# Patient Record
Sex: Female | Born: 1990 | Race: Black or African American | Hispanic: No | Marital: Single | Smoking: Never smoker
Health system: Southern US, Community
[De-identification: ages and names within clinical notes are randomized; demographics above are authoritative.]

## PROBLEM LIST (undated history)

## (undated) ENCOUNTER — Inpatient Hospital Stay (HOSPITAL_COMMUNITY): Payer: Self-pay

## (undated) DIAGNOSIS — J40 Bronchitis, not specified as acute or chronic: Secondary | ICD-10-CM

## (undated) DIAGNOSIS — R51 Headache: Secondary | ICD-10-CM

## (undated) DIAGNOSIS — T148XXA Other injury of unspecified body region, initial encounter: Secondary | ICD-10-CM

## (undated) HISTORY — DX: Bronchitis, not specified as acute or chronic: J40

## (undated) HISTORY — PX: NO PAST SURGERIES: SHX2092

---
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
## 2012-03-16 ENCOUNTER — Other Ambulatory Visit: Payer: Self-pay | Admitting: Family Medicine

## 2012-03-16 NOTE — Telephone Encounter (Signed)
MOM CALLED TO SAY PT HAS PINK EYE AGAIN.  WOULD LIKE MEDS CALL TO [facility] [address]  ALSO, NEEDS OOW NOTE   BEST [phone]

## 2012-03-16 NOTE — Addendum Note (Signed)
Addended by: Jacqualyn Posey on: 03/16/2012 07:14 PM   Modules accepted: Orders

## 2013-01-20 ENCOUNTER — Ambulatory Visit (INDEPENDENT_AMBULATORY_CARE_PROVIDER_SITE_OTHER): Payer: Managed Care, Other (non HMO) | Admitting: Family Medicine

## 2013-01-20 VITALS — BP 131/86 | HR 80 | Temp 98.0°F | Resp 16 | Ht 67.0 in | Wt 275.4 lb

## 2013-01-20 DIAGNOSIS — G43909 Migraine, unspecified, not intractable, without status migrainosus: Secondary | ICD-10-CM

## 2013-01-20 DIAGNOSIS — N912 Amenorrhea, unspecified: Secondary | ICD-10-CM

## 2013-01-20 LAB — POCT URINE PREGNANCY: Preg Test, Ur: NEGATIVE

## 2013-01-20 MED ORDER — IBUPROFEN 600 MG PO TABS: 600.0000 mg | ORAL_TABLET | Freq: Three times a day (TID) | ORAL | Status: DC | PRN

## 2013-01-20 NOTE — Patient Instructions (Signed)
Migraine Headache A migraine headache is an intense, throbbing pain on one or both sides of your head. A migraine can last for 30 minutes to several hours. CAUSES  The exact cause of a migraine headache is not always known. However, a migraine may be caused when nerves in the brain become irritated and release chemicals that cause inflammation. This causes pain. SYMPTOMS  Pain on one or both sides of your head.  Pulsating or throbbing pain.  Severe pain that prevents daily activities.  Pain that is aggravated by any physical activity.  Nausea, vomiting, or both.  Dizziness.  Pain with exposure to bright lights, loud noises, or activity.  General sensitivity to bright lights, loud noises, or smells. Before you get a migraine, you may get warning signs that a migraine is coming (aura). An aura may include:  Seeing flashing lights.  Seeing bright spots, halos, or zig-zag lines.  Having tunnel vision or blurred vision.  Having feelings of numbness or tingling.  Having trouble talking.  Having muscle weakness. MIGRAINE TRIGGERS  Alcohol.  Smoking.  Stress.  Menstruation.  Aged cheeses.  Foods or drinks that contain nitrates, glutamate, aspartame, or tyramine.  Lack of sleep.  Chocolate.  Caffeine.  Hunger.  Physical exertion.  Fatigue.  Medicines used to treat chest pain (nitroglycerine), birth control pills, estrogen, and some blood pressure medicines. DIAGNOSIS  A migraine headache is often diagnosed based on:  Symptoms.  Physical examination.  A CT scan or MRI of your head. TREATMENT Medicines may be given for pain and nausea. Medicines can also be given to help prevent recurrent migraines.  HOME CARE INSTRUCTIONS  Only take over-the-counter or prescription medicines for pain or discomfort as directed by your caregiver. The use of long-term narcotics is not recommended.  Lie down in a dark, quiet room when you have a migraine.  Keep a journal  to find out what may trigger your migraine headaches. For example, write down:  What you eat and drink.  How much sleep you get.  Any change to your diet or medicines.  Limit alcohol consumption.  Quit smoking if you smoke.  Get 7 to 9 hours of sleep, or as recommended by your caregiver.  Limit stress.  Keep lights dim if bright lights bother you and make your migraines worse. SEEK IMMEDIATE MEDICAL CARE IF:   Your migraine becomes severe.  You have a fever.  You have a stiff neck.  You have vision loss.  You have muscular weakness or loss of muscle control.  You start losing your balance or have trouble walking.  You feel faint or pass out.  You have severe symptoms that are different from your first symptoms. MAKE SURE YOU:   Understand these instructions.  Will watch your condition.  Will get help right away if you are not doing well or get worse. Document Released: 09/09/2005 Document Revised: 12/02/2011 Document Reviewed: 08/30/2011 Exit[employer] Patient Information 2013 [employer].  

## 2013-01-20 NOTE — Progress Notes (Signed)
SUBJECTIVE:  Lisa Rubio is a 22 y.o. female who complains of migraine headache for 1 day(s). She has a well established history of recurrent migraines.  Patient did stop her birth control secondary to spotting and is sexually active but does use protection.   Description of pain: throbbing pain, unilateral in the right temporal area. Associated symptoms: dehydration, facial pain and light sensitivity. Patient has already taken [facility] powder for this headache with minimal relief.  Current Outpatient Prescriptions  Medication Sig Dispense Refill  . norethindrone-ethinyl estradiol (JUNEL FE,GILDESS FE,LOESTRIN FE) 1-20 MG-MCG tablet Take 1 tablet by mouth daily.      Marland Kitchen ibuprofen (ADVIL,MOTRIN) 600 MG tablet Take 1 tablet (600 mg total) by mouth every 8 (eight) hours as needed for pain.  30 tablet  0   No current facility-administered medications for this visit.    There are no associated abnormal neurological symptoms such as TIA's, loss of balance, loss of vision or speech, numbness or weakness on review. Past neurological history: negative for stroke, MS, epilepsy, or brain tumor.   OBJECTIVE:  Patient appears in pain, preferring to lie in a darkened room. Her vitals are normal. Alert and oriented x 3.  Ears and throat normal. Neck fully supple without nodes. Sinuses non tender. Cranial nerves are normal.  Fundi are normal with sharp disc margins, no papilledema, hemorrhages or exudates noted. DTR's normal and symmetric. Babinski sign absent.  Mental status normal. Cerebellar function normal.     I preg negative.  Lab Results  Component Value Date   PREGTESTUR Negative 01/20/2013    ASSESSMENT:  Migraine headache  PLAN:  Treatment today -  see orders as documented in the electronic medical record. ROV prn if pain does not resolve after treatment.

## 2013-03-09 ENCOUNTER — Ambulatory Visit (INDEPENDENT_AMBULATORY_CARE_PROVIDER_SITE_OTHER): Payer: Managed Care, Other (non HMO) | Admitting: Family Medicine

## 2013-03-09 VITALS — BP 119/76 | HR 96 | Temp 98.3°F | Resp 16 | Ht 67.0 in | Wt 271.0 lb

## 2013-03-09 DIAGNOSIS — N926 Irregular menstruation, unspecified: Secondary | ICD-10-CM

## 2013-03-09 DIAGNOSIS — Z331 Pregnant state, incidental: Secondary | ICD-10-CM

## 2013-03-09 LAB — POCT URINE PREGNANCY: Preg Test, Ur: POSITIVE

## 2013-03-09 NOTE — Progress Notes (Signed)
S:  22 yo woman who stopped her birth control pills when she was spotting.  Her breasts have become sore. G0P0 Last normal period:  unsure Planning on keeping the pregnancy. Going to [employer] studying liberal studies. She grew up in [place].  Now lives in [place] with boyfriend. No nausea.  Objective:  NAD  Results for orders placed in visit on 03/09/13  POCT URINE PREGNANCY      Result Value Range   Preg Test, Ur Positive     Missed period - Plan: POCT urine pregnancy, PR PRENATAL VITAMINS 30 DAY, Ambulatory referral to Obstetrics / Gynecology  Signed, Elvina Sidle, MD

## 2013-03-09 NOTE — Patient Instructions (Addendum)
Shea Evans, MD  [phone]   Pregnancy If you are planning on getting pregnant, it is a good idea to make a preconception appointment with your caregiver to discuss having a healthy lifestyle before getting pregnant. This includes diet, weight, exercise, taking prenatal vitamins (especially folic acid, which helps prevent brain and spinal cord defects), avoiding alcohol, smoking and illegal drugs, medical problems (diabetes, convulsions), family history of genetic problems, working conditions, and immunizations. It is better to have knowledge of these things and do something about them before getting pregnant. During your pregnancy, it is important to follow certain guidelines in order to have a healthy baby. It is very important to get good prenatal care and follow your caregiver's instructions. Prenatal care includes all the medical care you receive before your baby's birth. This helps to prevent problems during the pregnancy and childbirth. HOME CARE INSTRUCTIONS   Start your prenatal visits by the 12th week of pregnancy or earlier, if possible. At first, appointments are usually scheduled monthly. They become more frequent in the last 2 months before delivery. It is important that you keep your caregiver's appointments and follow your caregiver's instructions regarding medication use, exercise, and diet.  During pregnancy, you are providing food for you and your baby. Eat a regular, well-balanced diet. Choose foods such as meat, fish, milk and other dairy products, vegetables, fruits, whole-grain breads and cereals. Your caregiver will inform you of the ideal weight gain depending on your current height and weight. Drink lots of liquids. Try to drink 8 glasses of water a day.  Alcohol is associated with a number of birth defects including fetal alcohol syndrome. It is best to avoid alcohol completely. Smoking will cause low birth rate and prematurity. Use of alcohol and nicotine during your  pregnancy also increases the chances that your child will be chemically dependent later in their life and may contribute to SIDS (Sudden Infant Death Syndrome).  Do not use illegal drugs.  Only take prescription or over-the-counter medications that are recommended by your caregiver. Other medications can cause genetic and physical problems in the baby.  Morning sickness can often be helped by keeping soda crackers at the bedside. Eat a few before getting up in the morning.  A sexual relationship may be continued until near the end of pregnancy if there are no other problems such as early (premature) leaking of amniotic fluid from the membranes, vaginal bleeding, painful intercourse or belly (abdominal) pain.  Exercise regularly. Check with your caregiver if you are unsure of the safety of some of your exercises.  Do not use hot tubs, steam rooms or saunas. These increase the risk of fainting and hurting yourself and the baby. Swimming is OK for exercise. Get plenty of rest, including afternoon naps when possible, especially in the third trimester.  Avoid toxic odors and chemicals.  Do not wear high heels. They may cause you to lose your balance and fall.  Do not lift over 5 pounds. If you do lift anything, lift with your legs and thighs, not your back.  Avoid long trips, especially in the third trimester.  If you have to travel out of the city or state, take a copy of your medical records with you. SEEK IMMEDIATE MEDICAL CARE IF:   You develop an unexplained oral temperature above 102 F (38.9 C), or as your caregiver suggests.  You have leaking of fluid from the vagina. If leaking membranes are suspected, take your temperature and inform your caregiver of this when  you call.  There is vaginal spotting or bleeding. Notify your caregiver of the amount and how many pads are used.  You continue to feel sick to your stomach (nauseous) and have no relief from remedies suggested, or you  throw up (vomit) blood or coffee ground like materials.  You develop upper abdominal pain.  You have round ligament discomfort in the lower abdominal area. This still must be evaluated by your caregiver.  You feel contractions of the uterus.  You do not feel the baby move, or there is less movement than before.  You have painful urination.  You have abnormal vaginal discharge.  You have persistent diarrhea.  You get a severe headache.  You have problems with your vision.  You develop muscle weakness.  You feel dizzy and faint.  You develop shortness of breath.  You develop chest pain.  You have back pain that travels down to your leg and feet.  You feel irregular or a very fast heartbeat.  You develop excessive weight gain in a short period of time (5 pounds in 3 to 5 days).  You are involved in a domestic violence situation. Document Released: 09/09/2005 Document Revised: 03/10/2012 Document Reviewed: 03/03/2009 [facility] Patient Information 2014 [employer], [place].

## 2013-03-10 ENCOUNTER — Inpatient Hospital Stay (HOSPITAL_COMMUNITY): Payer: Managed Care, Other (non HMO)

## 2013-03-10 ENCOUNTER — Inpatient Hospital Stay (HOSPITAL_COMMUNITY)
Admission: AD | Admit: 2013-03-10 | Discharge: 2013-03-10 | DRG: 781 | Disposition: A | Discharge status: Home / Self Care | Payer: Managed Care, Other (non HMO) | Source: Ambulatory Visit | Attending: Obstetrics and Gynecology | Admitting: Obstetrics and Gynecology

## 2013-03-10 ENCOUNTER — Encounter (HOSPITAL_COMMUNITY): Payer: Self-pay

## 2013-03-10 DIAGNOSIS — N644 Mastodynia: Secondary | ICD-10-CM | POA: Diagnosis present

## 2013-03-10 DIAGNOSIS — R109 Unspecified abdominal pain: Secondary | ICD-10-CM | POA: Diagnosis present

## 2013-03-10 DIAGNOSIS — O99891 Other specified diseases and conditions complicating pregnancy: Secondary | ICD-10-CM | POA: Diagnosis present

## 2013-03-10 LAB — URINALYSIS, ROUTINE W REFLEX MICROSCOPIC
Glucose, UA: NEGATIVE mg/dL
Ketones, ur: NEGATIVE mg/dL
Leukocytes, UA: NEGATIVE
Protein, ur: NEGATIVE mg/dL
Urobilinogen, UA: 4 mg/dL — ABNORMAL HIGH (ref 0.0–1.0)

## 2013-03-10 LAB — CBC
MCV: 81.1 fL (ref 78.0–100.0)
Platelets: 266 10*3/uL (ref 150–400)
RDW: 14 % (ref 11.5–15.5)
WBC: 7.3 10*3/uL (ref 4.0–10.5)

## 2013-03-10 NOTE — MAU Note (Signed)
Pt presents with breast pain and soul like to know how far along pregnant she is.

## 2013-03-10 NOTE — MAU Note (Signed)
Pregnancy letter given.. Pt to follow up with [facility] on Monday 03/15/13

## 2013-03-10 NOTE — MAU Provider Note (Signed)
History     CSN: [id number]  Arrival date and time: 03/10/13 1450   None     Chief Complaint  Patient presents with  . Breast Pain   HPI 22 y.o. G1P0 at Unknown with low abd pain x 1.5, breast pain x 1 week. No discharge or bleeding. Appt with Dr. Clearance Coots on Monday next week.   History reviewed. No pertinent past medical history.  History reviewed. No pertinent past surgical history.  No family history on file.  History  Substance Use Topics  . Smoking status: Never Smoker   . Smokeless tobacco: Not on file  . Alcohol Use: Not on file    Allergies: No Known Allergies  Prescriptions prior to admission  Medication Sig Dispense Refill  . ibuprofen (ADVIL,MOTRIN) 600 MG tablet Take 1 tablet (600 mg total) by mouth every 8 (eight) hours as needed for pain.  30 tablet  0  . norethindrone-ethinyl estradiol (JUNEL FE,GILDESS FE,LOESTRIN FE) 1-20 MG-MCG tablet Take 1 tablet by mouth daily.        Review of Systems  Constitutional: Negative.   Respiratory: Negative.   Cardiovascular: Negative.   Gastrointestinal: Positive for abdominal pain. Negative for nausea, vomiting, diarrhea and constipation.  Genitourinary: Negative for dysuria, urgency, frequency, hematuria and flank pain.       Negative for vaginal bleeding, vaginal discharge, dyspareunia  Musculoskeletal: Negative.   Neurological: Negative.   Psychiatric/Behavioral: Negative.    Physical Exam   Blood pressure 113/55, pulse 89, temperature 98.3 F (36.8 C), temperature source Oral, resp. rate 16, height 5\' 7"  (1.702 m), weight 273 lb (123.832 kg), last menstrual period 01/16/2013.  Physical Exam  Nursing note and vitals reviewed. Constitutional: She is oriented to person, place, and time. She appears well-developed and well-nourished. No distress.  Cardiovascular: Normal rate.   Respiratory: Effort normal.  Musculoskeletal: Normal range of motion.  Neurological: She is alert and oriented to person, place, and  time.  Skin: Skin is warm and dry.  Psychiatric: She has a normal mood and affect.    MAU Course  Procedures Results for orders placed during the hospital encounter of 03/10/13 (from the past 72 hour(s))  URINALYSIS, ROUTINE W REFLEX MICROSCOPIC     Status: Abnormal   Collection Time    03/10/13  3:24 PM      Result Value Range   Color, Urine YELLOW  YELLOW   APPearance CLEAR  CLEAR   Specific Gravity, Urine 1.025  1.005 - 1.030   pH 6.5  5.0 - 8.0   Glucose, UA NEGATIVE  NEGATIVE mg/dL   Hgb urine dipstick NEGATIVE  NEGATIVE   Bilirubin Urine NEGATIVE  NEGATIVE   Ketones, ur NEGATIVE  NEGATIVE mg/dL   Protein, ur NEGATIVE  NEGATIVE mg/dL   Urobilinogen, UA 4.0 (*) 0.0 - 1.0 mg/dL   Nitrite NEGATIVE  NEGATIVE   Leukocytes, UA NEGATIVE  NEGATIVE   Comment: MICROSCOPIC NOT DONE ON URINES WITH NEGATIVE PROTEIN, BLOOD, LEUKOCYTES, NITRITE, OR GLUCOSE <1000 mg/dL.  CBC     Status: Abnormal   Collection Time    03/10/13  4:00 PM      Result Value Range   WBC 7.3  4.0 - 10.5 K/uL   RBC 4.34  3.87 - 5.11 MIL/uL   Hemoglobin 11.9 (*) 12.0 - 15.0 g/dL   HCT 78.2 (*) 95.6 - 21.3 %   MCV 81.1  78.0 - 100.0 fL   MCH 27.4  26.0 - 34.0 pg   MCHC 33.8  30.0 - 36.0 g/dL   RDW 16.1  09.6 - 04.5 %   Platelets 266  150 - 400 K/uL  HCG, QUANTITATIVE, PREGNANCY     Status: Abnormal   Collection Time    03/10/13  4:00 PM      Result Value Range   hCG, Beta Chain, Quant, S 55335 (*) <5 mIU/mL   Comment:              GEST. AGE      CONC.  (mIU/mL)       <=1 WEEK        5 - 50         2 WEEKS       50 - 500         3 WEEKS       100 - 10,000         4 WEEKS     1,000 - 30,000         5 WEEKS     3,500 - 115,000       6-8 WEEKS     12,000 - 270,000        12 WEEKS     15,000 - 220,000                FEMALE AND NON-PREGNANT FEMALE:         LESS THAN 5 mIU/mL   U/S: 7.4 week IUP, + FHR, no adnexal mass  Assessment and Plan   1. Breast pain in pregnancy   Rev'd normal pregnancy sx vs.  Warning signs F/U as scheduled    Medication List    STOP taking these medications       norethindrone-ethinyl estradiol 1-20 MG-MCG tablet  Commonly known as:  JUNEL FE,GILDESS FE,LOESTRIN FE      TAKE these medications       ibuprofen 600 MG tablet  Commonly known as:  ADVIL,MOTRIN  Take 1 tablet (600 mg total) by mouth every 8 (eight) hours as needed for pain.            Follow-up Information   Follow up with [name],[name], MD. (as scheduled)    Contact information:   [address] [place] [address] [phone]         [facility] 03/10/2013, 5:34 PM

## 2013-03-10 NOTE — MAU Note (Signed)
Pt states here for breast pain noted x1 week, and lower abdominal pain x1.5 weeks. Unsure of LMP and wants u/s

## 2013-03-15 ENCOUNTER — Ambulatory Visit (INDEPENDENT_AMBULATORY_CARE_PROVIDER_SITE_OTHER): Payer: Managed Care, Other (non HMO) | Admitting: Obstetrics & Gynecology

## 2013-03-15 ENCOUNTER — Encounter: Payer: Self-pay | Admitting: Obstetrics & Gynecology

## 2013-03-15 VITALS — BP 115/77 | Temp 98.3°F | Wt 271.4 lb

## 2013-03-15 DIAGNOSIS — Z3201 Encounter for pregnancy test, result positive: Secondary | ICD-10-CM

## 2013-03-15 DIAGNOSIS — N760 Acute vaginitis: Secondary | ICD-10-CM

## 2013-03-15 DIAGNOSIS — Z113 Encounter for screening for infections with a predominantly sexual mode of transmission: Secondary | ICD-10-CM

## 2013-03-15 DIAGNOSIS — Z3401 Encounter for supervision of normal first pregnancy, first trimester: Secondary | ICD-10-CM

## 2013-03-15 LAB — POCT URINALYSIS DIPSTICK
Bilirubin, UA: NEGATIVE
Glucose, UA: NEGATIVE
Spec Grav, UA: 1.02

## 2013-03-15 NOTE — MAU Provider Note (Signed)
Attestation of Attending Supervision of Advanced Practitioner (CNM/NP): Evaluation and management procedures were performed by the Advanced Practitioner under my supervision and collaboration.  I have reviewed the Advanced Practitioner's note and chart, and I agree with the management and plan.  Corby Villasenor 03/15/2013 11:47 AM   

## 2013-03-15 NOTE — Progress Notes (Signed)
Pulse- 80 . Subjective:    Lisa Rubio is being seen today for her first obstetrical visit.  This is not a planned pregnancy. She is at [redacted]w[redacted]d gestation. Her obstetrical history is significant for obesity. Relationship with FOB: significant other, living together. Patient does intend to breast feed. Pregnancy history fully reviewed.  Menstrual History: OB History   Grav Para Term Preterm Abortions TAB SAB Ect Mult Living   1               Menarche age: 22 Patient's last menstrual period was 01/16/2013.    The following portions of the patient's history were reviewed and updated as appropriate: allergies, current medications, past family history, past medical history, past social history, past surgical history and problem list.  Review of Systems Pertinent items are noted in HPI.    Objective:    General appearance: alert and no distress Abdomen: normal findings: soft, non-tender Pelvic: cervix normal in appearance, external genitalia normal, no adnexal masses or tenderness, no cervical motion tenderness, uterus normal size, shape, and consistency and vagina normal without discharge    Assessment:    Pregnancy at [redacted]w[redacted]d weeks    Plan:    Initial labs drawn. Prenatal vitamins. Problem list reviewed and updated. AFP3 discussed: requested. Role of ultrasound in pregnancy discussed; fetal survey: requested. Amniocentesis discussed: not indicated. Follow up in 4 weeks. 50% of 20 min visit spent on counseling and coordination of care.

## 2013-03-16 LAB — WET PREP BY MOLECULAR PROBE
Gardnerella vaginalis: NEGATIVE
Trichomonas vaginosis: NEGATIVE

## 2013-03-16 LAB — OBSTETRIC PANEL
Basophils Absolute: 0 10*3/uL (ref 0.0–0.1)
Eosinophils Absolute: 0 10*3/uL (ref 0.0–0.7)
Eosinophils Relative: 1 % (ref 0–5)
Lymphocytes Relative: 36 % (ref 12–46)
MCV: 82.7 fL (ref 78.0–100.0)
Platelets: 285 10*3/uL (ref 150–400)
RDW: 14.3 % (ref 11.5–15.5)
Rubella: 2.78 Index — ABNORMAL HIGH (ref <0.90)
WBC: 7.3 10*3/uL (ref 4.0–10.5)

## 2013-03-16 LAB — GC/CHLAMYDIA PROBE AMP
CT Probe RNA: NEGATIVE
GC Probe RNA: NEGATIVE

## 2013-03-16 LAB — HIV ANTIBODY (ROUTINE TESTING W REFLEX): HIV: NONREACTIVE

## 2013-03-17 LAB — HEMOGLOBINOPATHY EVALUATION
Hgb A2 Quant: 3.1 % (ref 2.2–3.2)
Hgb A: 96.9 % (ref 96.8–97.8)
Hgb F Quant: 0 % (ref 0.0–2.0)
Hgb S Quant: 0 %

## 2013-03-19 ENCOUNTER — Encounter: Payer: Self-pay | Admitting: Obstetrics & Gynecology

## 2013-03-19 DIAGNOSIS — Z22330 Carrier of Group B streptococcus: Secondary | ICD-10-CM | POA: Insufficient documentation

## 2013-03-19 DIAGNOSIS — R8271 Bacteriuria: Secondary | ICD-10-CM | POA: Insufficient documentation

## 2013-03-19 DIAGNOSIS — E559 Vitamin D deficiency, unspecified: Secondary | ICD-10-CM | POA: Insufficient documentation

## 2013-04-05 ENCOUNTER — Other Ambulatory Visit: Payer: Self-pay | Admitting: Obstetrics & Gynecology

## 2013-04-05 ENCOUNTER — Encounter: Payer: Self-pay | Admitting: Obstetrics & Gynecology

## 2013-04-05 ENCOUNTER — Ambulatory Visit (INDEPENDENT_AMBULATORY_CARE_PROVIDER_SITE_OTHER): Payer: Managed Care, Other (non HMO) | Admitting: Obstetrics & Gynecology

## 2013-04-05 VITALS — BP 119/61 | Wt 273.0 lb

## 2013-04-05 DIAGNOSIS — Z3400 Encounter for supervision of normal first pregnancy, unspecified trimester: Secondary | ICD-10-CM

## 2013-04-05 DIAGNOSIS — Z3682 Encounter for antenatal screening for nuchal translucency: Secondary | ICD-10-CM

## 2013-04-05 DIAGNOSIS — Z3401 Encounter for supervision of normal first pregnancy, first trimester: Secondary | ICD-10-CM

## 2013-04-05 LAB — POCT URINALYSIS DIPSTICK
Nitrite, UA: NEGATIVE
Spec Grav, UA: 1.02
pH, UA: 5

## 2013-04-05 NOTE — Progress Notes (Signed)
Cardiac activity on U/S. 

## 2013-04-05 NOTE — Progress Notes (Signed)
Pulse- 106 Pt states she had experienced some spotting only while wiping about 1 week ago.

## 2013-04-05 NOTE — Patient Instructions (Addendum)
CF Gene Mutation Testing This is a test used to detect cystic fibrosis (CF) genetic mutations to establish CF carrier status or to establish the diagnosis of CF in an individual. The CF gene mutation test identifies mutations in the CFTR gene on chromosome 7. Each cell in the human body (except sperm and eggs) has 46 chromosomes (23 inherited from the mother and 23 from the father). Genes on these chromosomes form the body's blueprint for producing proteins that control body functions. Cystic fibrosis is caused by a mutation in a pair of genes located on chromosomes 7. Both copies of this gene must be abnormal to cause CF. If only one copy of the gene pair is mutated, the patient will be a carrier. Carriers are not ill, they do not have any symptoms, but they can pass their abnormal CF gene copy on to their children.  When a newborn infant has meconium ileus (no stools in the first 24 to 48 hours of life) or when a person has symptoms of CF (salty sweat, persistent respiratory infections, wheezing, persistent diarrhea, foul-smelling greasy stools, malnutrition, and vitamin deficiency); if a person has a positive sweat chloride or IRT test or a close relative who has been diagnosed with CF; when a patient is undergoing genetic counseling and wants to find out if they are a CF carrier; or for prenatal diagnosis. PREPARATION FOR TEST A blood sample drawn from an infant's heel; a spot of blood that is put onto filter paper; or a blood sample is drawn from a vein in the arm. NORMAL FINDINGS No genetic mutation. Ranges for normal findings may vary among different laboratories and hospitals. You should always check with your doctor after having lab work or other tests done to discuss the meaning of your test results and whether your values are considered within normal limits. MEANING OF TEST Your caregiver will go over the test results with you and discuss the importance and meaning of your results, as well as  treatment options and the need for additional tests if necessary. OBTAINING THE TEST RESULTS It is your responsibility to obtain your test results. Ask the lab or department performing the test when and how you will get your results. Document Released: 10/03/2004 Document Revised: 12/02/2011 Document Reviewed: 08/17/2008 [employer] Patient Information 2014 [employer].  

## 2013-04-16 ENCOUNTER — Ambulatory Visit (HOSPITAL_COMMUNITY): Payer: Managed Care, Other (non HMO)

## 2013-04-16 ENCOUNTER — Ambulatory Visit (HOSPITAL_COMMUNITY): Payer: Managed Care, Other (non HMO) | Attending: Obstetrics & Gynecology

## 2013-05-03 ENCOUNTER — Encounter: Payer: Self-pay | Admitting: Obstetrics & Gynecology

## 2013-05-03 ENCOUNTER — Ambulatory Visit (INDEPENDENT_AMBULATORY_CARE_PROVIDER_SITE_OTHER): Payer: Managed Care, Other (non HMO) | Admitting: Obstetrics & Gynecology

## 2013-05-03 VITALS — BP 114/79 | Temp 97.8°F | Wt 271.0 lb

## 2013-05-03 DIAGNOSIS — Z3402 Encounter for supervision of normal first pregnancy, second trimester: Secondary | ICD-10-CM

## 2013-05-03 DIAGNOSIS — Z3400 Encounter for supervision of normal first pregnancy, unspecified trimester: Secondary | ICD-10-CM

## 2013-05-03 LAB — POCT URINALYSIS DIPSTICK
Bilirubin, UA: NEGATIVE
Ketones, UA: NEGATIVE
Leukocytes, UA: NEGATIVE
pH, UA: 8

## 2013-05-03 MED ORDER — BUTALBITAL-APAP-CAFFEINE 50-325-40 MG PO TABS: 1.0000 | ORAL_TABLET | Freq: Two times a day (BID) | ORAL | Status: DC | PRN

## 2013-05-03 NOTE — Patient Instructions (Addendum)
AFP Maternal This is a routine screen (tests) used to check for fetal abnormalities such as Down syndrome and neural tube defects. Down Syndrome is a chromosomal abnormality, sometimes called Trisomy 21. Neural tube defects are serious birth defects. The brain, spinal cord, or their coverings do not develop completely. Women should be tested in the 15th to 20th week of pregnancy. The msAFP screen involves three or four tests that measure substances found in the blood that make the testing better. During development, AFP levels in fetal blood and amniotic fluid rise until about 12 weeks. The levels then gradually fall until birth. AFP is a protein produce by fetal tissue. AFP crosses the placenta and appears in the maternal blood. A baby with an open neural tube defect has an opening in its spine, head, or abdominal wall that allows higher-than-usual amounts of AFP to pass into the mother's blood. If a screen is positive, more tests are needed to make a diagnosis. These include ultrasound and perhaps amniocentesis (checking the fluid that surrounds the baby). These tests are used to help women and their caregivers make decisions about the management of their pregnancies. In pregnancies where the fetus is carrying the chromosomal defect that results in Down syndrome, the levels of AFP and unconjugated estriol tend to be low and hCG and inhibin A levels high.  PREPARATION FOR TEST Blood is drawn from a vein in your arm usually between the 15th and 20th weeks of pregnancy. Four different tests on your blood are done. These are AFP, hCG, unconjugated estriol, and inhibin A. The combination of tests produces a more accurate result. NORMAL FINDINGS   Adult: less than 40ng/mL or less than 40 mg/L (SI units)  Child younger than1 year: less than 30 ng/mL Ranges are stratified by weeks of gestation and vary among laboratories. Ranges for normal findings may vary among different laboratories and hospitals. You  should always check with your doctor after having lab work or other tests done to discuss the meaning of your test results and whether your values are considered within normal limits. MEANING OF TEST  These are screening tests. Not all fetal abnormalities will give positive test results. Of all women who have positive AFP screening results, only a very small number of them have babies who actually have a neural tube defect or chromosomal abnormality. Your caregiver will go over the test results with you and discuss the importance and meaning of your results, as well as treatment options and the need for additional tests if necessary. OBTAINING THE TEST RESULTS It is your responsibility to obtain your test results. Ask the lab or department performing the test when and how you will get your results. Document Released: 10/01/2004 Document Revised: 12/02/2011 Document Reviewed: 08/13/2008 [employer] Patient Information 2014 [employer].  

## 2013-05-03 NOTE — Progress Notes (Signed)
Pulse: 102

## 2013-05-03 NOTE — Progress Notes (Signed)
Doing well 

## 2013-05-04 ENCOUNTER — Encounter: Payer: Self-pay | Admitting: Obstetrics & Gynecology

## 2013-05-04 LAB — CULTURE, OB URINE: Organism ID, Bacteria: NO GROWTH

## 2013-05-05 LAB — AFP, QUAD SCREEN
HCG, Total: 35339 m[IU]/mL
INH: 209 pg/mL
MoM for AFP: 1.52
MoM for hCG: 1.6
Open Spina bifida: NEGATIVE
Osb Risk: 1:5270 {titer}
Tri 18 Scr Risk Est: NEGATIVE
Trisomy 18 (Edward) Syndrome Interp.: 1:50300 {titer}
uE3 Mom: 0.65
uE3 Value: 0.2 ng/mL

## 2013-05-06 ENCOUNTER — Other Ambulatory Visit: Payer: Self-pay | Admitting: *Deleted

## 2013-05-10 ENCOUNTER — Inpatient Hospital Stay (HOSPITAL_COMMUNITY)
Admission: AD | Admit: 2013-05-10 | Discharge: 2013-05-10 | Disposition: A | Discharge status: Home / Self Care | Payer: Managed Care, Other (non HMO) | Source: Ambulatory Visit | Attending: Obstetrics | Admitting: Obstetrics

## 2013-05-10 ENCOUNTER — Encounter (HOSPITAL_COMMUNITY): Payer: Self-pay | Admitting: *Deleted

## 2013-05-10 DIAGNOSIS — R109 Unspecified abdominal pain: Secondary | ICD-10-CM | POA: Insufficient documentation

## 2013-05-10 DIAGNOSIS — O2390 Unspecified genitourinary tract infection in pregnancy, unspecified trimester: Secondary | ICD-10-CM | POA: Insufficient documentation

## 2013-05-10 DIAGNOSIS — N949 Unspecified condition associated with female genital organs and menstrual cycle: Secondary | ICD-10-CM

## 2013-05-10 DIAGNOSIS — N390 Urinary tract infection, site not specified: Secondary | ICD-10-CM | POA: Insufficient documentation

## 2013-05-10 HISTORY — DX: Headache: R51

## 2013-05-10 HISTORY — DX: Other injury of unspecified body region, initial encounter: T14.8XXA

## 2013-05-10 LAB — URINALYSIS, ROUTINE W REFLEX MICROSCOPIC
Bilirubin Urine: NEGATIVE
Glucose, UA: NEGATIVE mg/dL
Ketones, ur: NEGATIVE mg/dL
Specific Gravity, Urine: 1.025 (ref 1.005–1.030)
pH: 6 (ref 5.0–8.0)

## 2013-05-10 LAB — URINE MICROSCOPIC-ADD ON

## 2013-05-10 LAB — OB RESULTS CONSOLE GC/CHLAMYDIA
Chlamydia: NEGATIVE
GC PROBE AMP, GENITAL: NEGATIVE

## 2013-05-10 LAB — WET PREP, GENITAL
Clue Cells Wet Prep HPF POC: NONE SEEN
Trich, Wet Prep: NONE SEEN
Yeast Wet Prep HPF POC: NONE SEEN

## 2013-05-10 MED ORDER — NITROFURANTOIN MONOHYD MACRO 100 MG PO CAPS: 100.0000 mg | ORAL_CAPSULE | Freq: Two times a day (BID) | ORAL | Status: DC

## 2013-05-10 NOTE — MAU Note (Signed)
Sharp pain in rt mid back.   abd pain last night, tightening/cramping.

## 2013-05-10 NOTE — MAU Provider Note (Signed)
History     CSN: [id number]  Arrival date and time: 05/10/13 9147   None     Chief Complaint  Patient presents with  . Abdominal Pain   HPI Comments: Lisa Rubio is an 22 YO G1P0 that is [redacted] weeks pregnant complaining of abdomen pains off and on sine yesterday that are described at cramping sensations. Denies any symptoms of UTI or bleeding from vagina. Last intercourse was 2-3 days ago. FHT were heard today. She has not felt baby move. Has had an U/S in early pregnancy.     Abdominal Pain Pertinent negatives include no dysuria, frequency or hematuria.      Past Medical History  Diagnosis Date  . Bronchitis   . Headache(784.0)   . Asthma   . Fracture     left ankle    Past Surgical History  Procedure Laterality Date  . No past surgeries      Family History  Problem Relation Age of Onset  . Anemia Mother   . Migraines Mother   . Diabetes Maternal Grandmother   . Cancer Maternal Aunt     great aunt- lung  . Cancer Maternal Uncle     great uncle colon    History  Substance Use Topics  . Smoking status: Never Smoker   . Smokeless tobacco: Never Used  . Alcohol Use: No    Allergies: No Known Allergies  Prescriptions prior to admission  Medication Sig Dispense Refill  . butalbital-acetaminophen-caffeine (FIORICET, ESGIC) 50-325-40 MG per tablet Take 1 tablet by mouth 2 (two) times daily as needed for headache.      . [DISCONTINUED] butalbital-acetaminophen-caffeine (FIORICET, ESGIC) 50-325-40 MG per tablet Take 1 tablet by mouth 2 (two) times daily as needed for headache.  30 tablet  3    Review of Systems  Constitutional: Negative.   HENT: Negative.   Eyes: Negative.   Gastrointestinal: Positive for abdominal pain.  Genitourinary: Positive for flank pain. Negative for dysuria, urgency, frequency and hematuria.       Pains can move to right flank  Skin: Negative.   Neurological: Negative.   Psychiatric/Behavioral: Negative.    Physical Exam    Blood pressure 116/61, pulse 93, temperature 97.3 F (36.3 C), temperature source Oral, resp. rate 20, height 5\' 7"  (1.702 m), weight 123.651 kg (272 lb 9.6 oz), last menstrual period 01/16/2013.  Physical Exam  Constitutional: She is oriented to person, place, and time. She appears well-developed and well-nourished.  HENT:  Head: Normocephalic and atraumatic.  Eyes: Pupils are equal, round, and reactive to light.  Cardiovascular: Normal rate and regular rhythm.   Respiratory: Effort normal and breath sounds normal.  GI: Soft. Bowel sounds are normal. She exhibits no distension and no mass. There is no tenderness. There is no rebound and no guarding.  Musculoskeletal: Normal range of motion.  Neurological: She is alert and oriented to person, place, and time. She has normal reflexes.  Skin: Skin is warm.  Psychiatric: She has a normal mood and affect.   Results for orders placed during the hospital encounter of 05/10/13 (from the past 24 hour(s))  URINALYSIS, ROUTINE W REFLEX MICROSCOPIC     Status: Abnormal   Collection Time    05/10/13  7:50 AM      Result Value Range   Color, Urine YELLOW  YELLOW   APPearance HAZY (*) CLEAR   Specific Gravity, Urine 1.025  1.005 - 1.030   pH 6.0  5.0 - 8.0   Glucose, UA  NEGATIVE  NEGATIVE mg/dL   Hgb urine dipstick LARGE (*) NEGATIVE   Bilirubin Urine NEGATIVE  NEGATIVE   Ketones, ur NEGATIVE  NEGATIVE mg/dL   Protein, ur NEGATIVE  NEGATIVE mg/dL   Urobilinogen, UA 1.0  0.0 - 1.0 mg/dL   Nitrite NEGATIVE  NEGATIVE   Leukocytes, UA NEGATIVE  NEGATIVE  URINE MICROSCOPIC-ADD ON     Status: Abnormal   Collection Time    05/10/13  7:50 AM      Result Value Range   Squamous Epithelial / LPF MANY (*) RARE   WBC, UA 0-2  <3 WBC/hpf   RBC / HPF 21-50  <3 RBC/hpf   Bacteria, UA MANY (*) RARE   Results for orders placed during the hospital encounter of 05/10/13 (from the past 24 hour(s))  URINALYSIS, ROUTINE W REFLEX MICROSCOPIC     Status:  Abnormal   Collection Time    05/10/13  7:50 AM      Result Value Range   Color, Urine YELLOW  YELLOW   APPearance HAZY (*) CLEAR   Specific Gravity, Urine 1.025  1.005 - 1.030   pH 6.0  5.0 - 8.0   Glucose, UA NEGATIVE  NEGATIVE mg/dL   Hgb urine dipstick LARGE (*) NEGATIVE   Bilirubin Urine NEGATIVE  NEGATIVE   Ketones, ur NEGATIVE  NEGATIVE mg/dL   Protein, ur NEGATIVE  NEGATIVE mg/dL   Urobilinogen, UA 1.0  0.0 - 1.0 mg/dL   Nitrite NEGATIVE  NEGATIVE   Leukocytes, UA NEGATIVE  NEGATIVE  URINE MICROSCOPIC-ADD ON     Status: Abnormal   Collection Time    05/10/13  7:50 AM      Result Value Range   Squamous Epithelial / LPF MANY (*) RARE   WBC, UA 0-2  <3 WBC/hpf   RBC / HPF 21-50  <3 RBC/hpf   Bacteria, UA MANY (*) RARE  WET PREP, GENITAL     Status: Abnormal   Collection Time    05/10/13  9:12 AM      Result Value Range   Yeast Wet Prep HPF POC NONE SEEN  NONE SEEN   Trich, Wet Prep NONE SEEN  NONE SEEN   Clue Cells Wet Prep HPF POC NONE SEEN  NONE SEEN   WBC, Wet Prep HPF POC FEW (*) NONE SEEN     MAU Course  Procedures  MDM UA, Wet prep, GC/ Chlamydai  Assessment and Plan   A: UTI ? P: Macrobid 100 mg X 7 days Increase fliuds Note for work today  Carolynn Serve 05/10/2013, 8:49 AM

## 2013-05-10 NOTE — MAU Note (Signed)
Patient states she has been having generalized abdominal pain since last night. Denies bleeding or discharge.

## 2013-05-11 LAB — GC/CHLAMYDIA PROBE AMP: GC Probe RNA: NEGATIVE

## 2013-05-11 NOTE — MAU Provider Note (Signed)
Attestation of Attending Supervision of Advanced Practitioner (CNM/NP): Evaluation and management procedures were performed by the Advanced Practitioner under my supervision and collaboration.  I have reviewed the Advanced Practitioner's note and chart, and I agree with the management and plan.  Makyle Eslick 05/11/2013 6:34 AM

## 2013-05-13 ENCOUNTER — Other Ambulatory Visit: Payer: Self-pay | Admitting: *Deleted

## 2013-05-13 DIAGNOSIS — Z3402 Encounter for supervision of normal first pregnancy, second trimester: Secondary | ICD-10-CM

## 2013-05-26 ENCOUNTER — Encounter: Payer: Self-pay | Admitting: Obstetrics & Gynecology

## 2013-05-26 ENCOUNTER — Ambulatory Visit (INDEPENDENT_AMBULATORY_CARE_PROVIDER_SITE_OTHER): Payer: Managed Care, Other (non HMO)

## 2013-05-26 DIAGNOSIS — Z1389 Encounter for screening for other disorder: Secondary | ICD-10-CM

## 2013-05-26 DIAGNOSIS — Z3402 Encounter for supervision of normal first pregnancy, second trimester: Secondary | ICD-10-CM

## 2013-05-26 LAB — US OB DETAIL + 14 WK

## 2013-06-03 ENCOUNTER — Encounter: Payer: Self-pay | Admitting: Obstetrics & Gynecology

## 2013-06-03 ENCOUNTER — Ambulatory Visit (INDEPENDENT_AMBULATORY_CARE_PROVIDER_SITE_OTHER): Payer: Managed Care, Other (non HMO) | Admitting: Obstetrics & Gynecology

## 2013-06-03 VITALS — BP 139/78 | Temp 98.3°F | Wt 274.0 lb

## 2013-06-03 DIAGNOSIS — Z3480 Encounter for supervision of other normal pregnancy, unspecified trimester: Secondary | ICD-10-CM

## 2013-06-03 DIAGNOSIS — Z3482 Encounter for supervision of other normal pregnancy, second trimester: Secondary | ICD-10-CM

## 2013-06-03 LAB — POCT URINALYSIS DIPSTICK
Ketones, UA: NEGATIVE
Urobilinogen, UA: NEGATIVE
pH, UA: 5

## 2013-06-03 NOTE — Progress Notes (Signed)
Counseled re: flu vaccine.

## 2013-06-03 NOTE — Progress Notes (Signed)
P 94 Patient reports she has no concerns at this time.

## 2013-06-03 NOTE — Patient Instructions (Signed)
Influenza Vaccine (Flu Vaccine, Inactivated) 2013 2014 What You Need to Know WHY GET VACCINATED?  Influenza ("flu") is a contagious disease that spreads around the [place] every winter, usually between October and May.  Flu is caused by the influenza virus, and can be spread by coughing, sneezing, and close contact.  Anyone can get flu, but the risk of getting flu is highest among children. Symptoms come on suddenly and may last several days. They can include:  Fever or chills.  Sore throat.  Muscle aches.  Fatigue.  Cough.  Headache.  Runny or stuffy nose. Flu can make some people much sicker than others. These people include young children, people 65 and older, pregnant women, and people with certain health conditions such as heart, lung or kidney disease, or a weakened immune system. Flu vaccine is especially important for these people, and anyone in close contact with them. Flu can also lead to pneumonia, and make existing medical conditions worse. It can cause diarrhea and seizures in children. Each year thousands of people in the [place] die from flu, and many more are hospitalized. Flu vaccine is the best protection we have from flu and its complications. Flu vaccine also helps prevent spreading flu from person to person. INACTIVATED FLU VACCINE There are 2 types of influenza vaccine:  You are getting an inactivated flu vaccine, which does not contain any live influenza virus. It is given by injection with a needle, and often called the "flu shot."  A different live, attenuated (weakened) influenza vaccine is sprayed into the nostrils. This vaccine is described in a separate Vaccine Information Statement. Flu vaccine is recommended every year. Children 6 months through 8 years of age should get 2 doses the first year they get vaccinated. Flu viruses are always changing. Each year's flu vaccine is made to protect from viruses that are most likely to cause disease  that year. While flu vaccine cannot prevent all cases of flu, it is our best defense against the disease. Inactivated flu vaccine protects against 3 or 4 different influenza viruses. It takes about 2 weeks for protection to develop after the vaccination, and protection lasts several months to a year. Some illnesses that are not caused by influenza virus are often mistaken for flu. Flu vaccine will not prevent these illnesses. It can only prevent influenza. A "high-dose" flu vaccine is available for people 65 years of age and older. The person giving you the vaccine can tell you more about it. Some inactivated flu vaccine contains a very small amount of a mercury-based preservative called thimerosal. Studies have shown that thimerosal in vaccines is not harmful, but flu vaccines that do not contain a preservative are available. SOME PEOPLE SHOULD NOT GET THIS VACCINE Tell the person who gives you the vaccine:  If you have any severe (life-threatening) allergies. If you ever had a life-threatening allergic reaction after a dose of flu vaccine, or have a severe allergy to any part of this vaccine, you may be advised not to get a dose. Most, but not all, types of flu vaccine contain a small amount of egg.  If you ever had Guillain Barr Syndrome (a severe paralyzing illness, also called GBS). Some people with a history of GBS should not get this vaccine. This should be discussed with your doctor.  If you are not feeling well. They might suggest waiting until you feel better. But you should come back. RISKS OF A VACCINE REACTION With a vaccine, like any medicine, there   is a chance of side effects. These are usually mild and go away on their own. Serious side effects are also possible, but are very rare. Inactivated flu vaccine does not contain live flu virus, sogetting flu from this vaccine is not possible. Brief fainting spells and related symptoms (such as jerking movements) can happen after any medical  procedure, including vaccination. Sitting or lying down for about 15 minutes after a vaccination can help prevent fainting and injuries caused by falls. Tell your doctor if you feel dizzy or lightheaded, or have vision changes or ringing in the ears. Mild problems following inactivated flu vaccine:  Soreness, redness, or swelling where the shot was given.  Hoarseness; sore, red or itchy eyes; or cough.  Fever.  Aches.  Headache.  Itching.  Fatigue. If these problems occur, they usually begin soon after the shot and last 1 or 2 days. Moderate problems following inactivated flu vaccine:  Young children who get inactivated flu vaccine and pneumococcal vaccine (PCV13) at the same time may be at increased risk for seizures caused by fever. Ask your doctor for more information. Tell your doctor if a child who is getting flu vaccine has ever had a seizure. Severe problems following inactivated flu vaccine:  A severe allergic reaction could occur after any vaccine (estimated less than 1 in a million doses).  There is a small possibility that inactivated flu vaccine could be associated with Guillan Barr Syndrome (GBS), no more than 1 or 2 cases per million people vaccinated. This is much lower than the risk of severe complications from flu, which can be prevented by flu vaccine. The safety of vaccines is always being monitored. For more information, visit: www.cdc.gov/vaccinesafety/ WHAT IF THERE IS A SERIOUS REACTION? What should I look for?  Look for anything that concerns you, such as signs of a severe allergic reaction, very high fever, or behavior changes. Signs of a severe allergic reaction can include hives, swelling of the face and throat, difficulty breathing, a fast heartbeat, dizziness, and weakness. These would start a few minutes to a few hours after the vaccination. What should I do?  If you think it is a severe allergic reaction or other emergency that cannot wait, call [phone] 1 1  or get the person to the nearest hospital. Otherwise, call your doctor.  Afterward, the reaction should be reported to the Vaccine Adverse Event Reporting System (VAERS). Your doctor might file this report, or you can do it yourself through the VAERS website at www.vaers.hhs.gov, or by calling 1-800-822-7967. VAERS is only for reporting reactions. They do not give medical advice. THE NATIONAL VACCINE INJURY COMPENSATION PROGRAM The National Vaccine Injury Compensation Program (VICP) is a federal program that was created to compensate people who may have been injured by certain vaccines. Persons who believe they may have been injured by a vaccine can learn about the program and about filing a claim by calling 1-800-338-2382 or visiting the VICP website at www.hrsa.gov/vaccinecompensation HOW CAN I LEARN MORE?  Ask your doctor.  Call your local or state health department.  Contact the Centers for Disease Control and Prevention (CDC):  Call 1-800-232-4636 (1-800-CDC-INFO) or  Visit CDC's website at www.cdc.gov/flu CDC Inactivated Influenza Vaccine Interim VIS (04/17/12) Document Released: 07/04/2006 Document Revised: 06/03/2012 Document Reviewed: 04/17/2012 [employer] Patient Information 2014 [employer].  

## 2013-06-23 ENCOUNTER — Other Ambulatory Visit: Payer: Self-pay | Admitting: Obstetrics & Gynecology

## 2013-06-23 DIAGNOSIS — Z3689 Encounter for other specified antenatal screening: Secondary | ICD-10-CM

## 2013-06-30 ENCOUNTER — Ambulatory Visit (INDEPENDENT_AMBULATORY_CARE_PROVIDER_SITE_OTHER): Payer: Managed Care, Other (non HMO)

## 2013-06-30 ENCOUNTER — Other Ambulatory Visit: Payer: Managed Care, Other (non HMO)

## 2013-06-30 ENCOUNTER — Encounter: Payer: Self-pay | Admitting: Obstetrics & Gynecology

## 2013-06-30 DIAGNOSIS — Z3689 Encounter for other specified antenatal screening: Secondary | ICD-10-CM

## 2013-06-30 DIAGNOSIS — Z1389 Encounter for screening for other disorder: Secondary | ICD-10-CM

## 2013-06-30 LAB — US OB DETAIL + 14 WK

## 2013-07-01 ENCOUNTER — Ambulatory Visit (INDEPENDENT_AMBULATORY_CARE_PROVIDER_SITE_OTHER): Payer: Managed Care, Other (non HMO) | Admitting: Obstetrics & Gynecology

## 2013-07-01 VITALS — BP 114/75 | Temp 98.3°F | Wt 274.0 lb

## 2013-07-01 DIAGNOSIS — Z3402 Encounter for supervision of normal first pregnancy, second trimester: Secondary | ICD-10-CM

## 2013-07-01 DIAGNOSIS — R82998 Other abnormal findings in urine: Secondary | ICD-10-CM

## 2013-07-01 DIAGNOSIS — Z3400 Encounter for supervision of normal first pregnancy, unspecified trimester: Secondary | ICD-10-CM

## 2013-07-01 DIAGNOSIS — R8271 Bacteriuria: Secondary | ICD-10-CM

## 2013-07-01 LAB — POCT URINALYSIS DIPSTICK
Leukocytes, UA: NEGATIVE
Spec Grav, UA: 1.025
Urobilinogen, UA: NEGATIVE

## 2013-07-01 NOTE — Progress Notes (Signed)
Pulse- 105 Doing well. 

## 2013-07-04 ENCOUNTER — Encounter: Payer: Self-pay | Admitting: Obstetrics & Gynecology

## 2013-07-04 NOTE — Patient Instructions (Signed)
Pregnancy - Second Trimester The second trimester is the period between 13 to 27 weeks of your pregnancy. It is important to follow your doctor's instructions. HOME CARE   Do not smoke.  Do not drink alcohol or use drugs.  Only take medicine as told by your doctor.  Take prenatal vitamins as told. The vitamin should contain 1 milligram of folic acid.  Exercise.  Eat healthy foods. Eat regular, well-balanced meals.  You can have sex (intercourse) if there are no other problems with the pregnancy.  Do not use hot tubs, steam rooms, or saunas.  Wear a seat belt while driving.  Avoid raw meat, uncooked cheese, and litter boxes and soil used by cats.  Visit your dentist. Cleanings are okay. GET HELP RIGHT AWAY IF:   You have a temperature by mouth above 102 F (38.9 C), not controlled by medicine.  Fluid is coming from your vagina.  Blood is coming from your vagina. Light spotting is common, especially after sex (intercourse).  You have a bad smelling fluid (discharge) coming from the vagina. The fluid changes from clear to white.  You still feel sick to your stomach (nauseous).  You throw up (vomit) blood.  You lose or gain more than 2 pounds (0.9 kilograms) of weight in a week, or as suggested by your doctor.  Your face, hands, feet, or legs get puffy (swell).  You get exposed to German measles and have never had them.  You get exposed to fifth disease or chickenpox.  You have belly (abdominal) pain.  You have a bad headache that will not go away.  You have watery poop (diarrhea), pain when you pee (urinate), or have shortness of breath.  You start to have problems seeing (blurry or double vision).  You fall, are in a car accident, or have any kind of trauma.  There is mental or physical violence at home.  You have any concerns or worries during your pregnancy. MAKE SURE YOU:   Understand these instructions.  Will watch your condition.  Will get help  right away if you are not doing well or get worse. Document Released: 12/04/2009 Document Revised: 12/02/2011 Document Reviewed: 12/04/2009 [employer] Patient Information 2014 [employer].  

## 2013-07-07 ENCOUNTER — Encounter: Payer: Managed Care, Other (non HMO) | Admitting: Obstetrics & Gynecology

## 2013-07-23 ENCOUNTER — Inpatient Hospital Stay (HOSPITAL_COMMUNITY)
Admission: AD | Admit: 2013-07-23 | Discharge: 2013-07-24 | Disposition: A | Discharge status: Home / Self Care | Payer: Managed Care, Other (non HMO) | Source: Ambulatory Visit | Attending: Obstetrics & Gynecology | Admitting: Obstetrics & Gynecology

## 2013-07-23 DIAGNOSIS — O99891 Other specified diseases and conditions complicating pregnancy: Secondary | ICD-10-CM | POA: Insufficient documentation

## 2013-07-23 DIAGNOSIS — E860 Dehydration: Secondary | ICD-10-CM | POA: Insufficient documentation

## 2013-07-23 DIAGNOSIS — M549 Dorsalgia, unspecified: Secondary | ICD-10-CM

## 2013-07-23 DIAGNOSIS — R109 Unspecified abdominal pain: Secondary | ICD-10-CM | POA: Insufficient documentation

## 2013-07-23 DIAGNOSIS — R110 Nausea: Secondary | ICD-10-CM | POA: Insufficient documentation

## 2013-07-23 NOTE — MAU Note (Signed)
I have had pain in my R back since Thurs night. At work tonight pain was sharp in back. Here few wks ago and told had blood in my pee. Took Macrobid and finished it. Told if i had sharp pains in stomach or back to return

## 2013-07-24 ENCOUNTER — Encounter (HOSPITAL_COMMUNITY): Payer: Self-pay | Admitting: *Deleted

## 2013-07-24 LAB — URINALYSIS, ROUTINE W REFLEX MICROSCOPIC
Glucose, UA: NEGATIVE mg/dL
Ketones, ur: 15 mg/dL — AB
Leukocytes, UA: NEGATIVE
Nitrite: NEGATIVE
Protein, ur: NEGATIVE mg/dL
pH: 6 (ref 5.0–8.0)

## 2013-07-24 LAB — URINE MICROSCOPIC-ADD ON

## 2013-07-24 MED ORDER — PROMETHAZINE HCL 25 MG PO TABS: 25.0000 mg | ORAL_TABLET | Freq: Four times a day (QID) | ORAL | Status: DC | PRN

## 2013-07-24 MED ORDER — CYCLOBENZAPRINE HCL 10 MG PO TABS: 10.0000 mg | ORAL_TABLET | Freq: Two times a day (BID) | ORAL | Status: DC | PRN

## 2013-07-24 NOTE — MAU Provider Note (Signed)
History     CSN: [id number]  Arrival date and time: 07/23/13 2339   First Provider Initiated Contact with Patient 07/24/13 0027      Chief Complaint  Patient presents with  . Flank Pain   HPI Comments: Lisa Rubio 22 y.o. G1P0 presents to MAU for right back pain since yesterday, denies any other sx of UTI. She has taken no medications.  She is feeling baby move. No LOF, vaginal discharge or bleeding. She works at [employer] and must stand on her feet a lot. She has not gotten maternity belt. She feels nauseated at times and has no nausea medications.     Flank Pain Pertinent negatives include no dysuria.      Past Medical History  Diagnosis Date  . Bronchitis   . Headache(784.0)   . Fracture     left ankle    Past Surgical History  Procedure Laterality Date  . No past surgeries      Family History  Problem Relation Age of Onset  . Anemia Mother   . Migraines Mother   . Diabetes Maternal Grandmother   . Cancer Maternal Aunt     great aunt- lung  . Cancer Maternal Uncle     great uncle colon    History  Substance Use Topics  . Smoking status: Never Smoker   . Smokeless tobacco: Never Used  . Alcohol Use: No    Allergies: No Known Allergies  Prescriptions prior to admission  Medication Sig Dispense Refill  . butalbital-acetaminophen-caffeine (FIORICET, ESGIC) 50-325-40 MG per tablet Take 1 tablet by mouth 2 (two) times daily as needed for headache.      . nitrofurantoin, macrocrystal-monohydrate, (MACROBID) 100 MG capsule Take 1 capsule (100 mg total) by mouth 2 (two) times daily.  14 capsule  0  . prenatal vitamin w/FE, FA (PRENATAL 1 + 1) 27-1 MG TABS tablet Take 1 tablet by mouth daily at 12 noon.        Review of Systems  Constitutional: Negative.   HENT: Negative.   Eyes: Negative.   Respiratory: Negative.   Cardiovascular: Negative.   Gastrointestinal: Positive for nausea.  Genitourinary: Positive for flank pain. Negative for dysuria,  urgency and frequency.       Right flank pain/ may be muscle  Musculoskeletal: Positive for back pain.  Skin: Negative.   Neurological: Negative.   Endo/Heme/Allergies: Negative.   Psychiatric/Behavioral: Negative.    Physical Exam   Blood pressure 130/61, pulse 114, temperature 98.8 F (37.1 C), resp. rate 20, height 5\' 7"  (1.702 m), weight 278 lb 6.4 oz (126.281 kg), last menstrual period 01/16/2013, SpO2 99.00%.  Physical Exam  Constitutional: She is oriented to person, place, and time. She appears well-developed and well-nourished. No distress.  HENT:  Head: Normocephalic and atraumatic.  Neck: Normal range of motion.  Cardiovascular: Normal rate, regular rhythm and normal heart sounds.   Respiratory: Effort normal and breath sounds normal. No respiratory distress.  GI: Soft. Bowel sounds are normal. She exhibits no distension. There is no tenderness.  Genitourinary:  Not examined  Musculoskeletal: Normal range of motion. She exhibits tenderness.  Right lower back  Neurological: She is alert and oriented to person, place, and time.  Skin: Skin is warm and dry.  Psychiatric: She has a normal mood and affect. Her behavior is normal. Judgment and thought content normal.   Results for orders placed during the hospital encounter of 07/23/13 (from the past 24 hour(s))  URINALYSIS, ROUTINE W REFLEX MICROSCOPIC  Status: Abnormal   Collection Time    07/23/13 11:55 PM      Result Value Range   Color, Urine YELLOW  YELLOW   APPearance CLEAR  CLEAR   Specific Gravity, Urine >1.030 (*) 1.005 - 1.030   pH 6.0  5.0 - 8.0   Glucose, UA NEGATIVE  NEGATIVE mg/dL   Hgb urine dipstick TRACE (*) NEGATIVE   Bilirubin Urine NEGATIVE  NEGATIVE   Ketones, ur 15 (*) NEGATIVE mg/dL   Protein, ur NEGATIVE  NEGATIVE mg/dL   Urobilinogen, UA 1.0  0.0 - 1.0 mg/dL   Nitrite NEGATIVE  NEGATIVE   Leukocytes, UA NEGATIVE  NEGATIVE  URINE MICROSCOPIC-ADD ON     Status: Abnormal   Collection Time     07/23/13 11:55 PM      Result Value Range   Squamous Epithelial / LPF FEW (*) RARE   WBC, UA 0-2  <3 WBC/hpf   RBC / HPF 3-6  <3 RBC/hpf   Bacteria, UA FEW (*) RARE     MAU Course  Procedures  MDM  UA  Assessment and Plan   A: Right back pain likely M/S Nausea/ dehydration  P: Flexeril 10 mg TID prn Phenergan 25 mg Q6 hrs prn Advise to increase fluids significantly  Follow up with dr Haroldine Laws, Rubbie Battiest 07/24/2013, 12:33 AM

## 2013-07-29 ENCOUNTER — Other Ambulatory Visit: Payer: Self-pay

## 2013-07-29 ENCOUNTER — Other Ambulatory Visit: Payer: Managed Care, Other (non HMO)

## 2013-07-29 ENCOUNTER — Ambulatory Visit (INDEPENDENT_AMBULATORY_CARE_PROVIDER_SITE_OTHER): Payer: Managed Care, Other (non HMO) | Admitting: Obstetrics & Gynecology

## 2013-07-29 VITALS — BP 110/66 | Temp 97.1°F | Wt 275.0 lb

## 2013-07-29 DIAGNOSIS — Z3402 Encounter for supervision of normal first pregnancy, second trimester: Secondary | ICD-10-CM

## 2013-07-29 DIAGNOSIS — O99212 Obesity complicating pregnancy, second trimester: Secondary | ICD-10-CM

## 2013-07-29 DIAGNOSIS — Z3400 Encounter for supervision of normal first pregnancy, unspecified trimester: Secondary | ICD-10-CM | POA: Insufficient documentation

## 2013-07-29 DIAGNOSIS — E669 Obesity, unspecified: Secondary | ICD-10-CM

## 2013-07-29 DIAGNOSIS — O99210 Obesity complicating pregnancy, unspecified trimester: Secondary | ICD-10-CM | POA: Insufficient documentation

## 2013-07-29 LAB — CBC
MCH: 27.3 pg (ref 26.0–34.0)
MCV: 85.9 fL (ref 78.0–100.0)
Platelets: 272 10*3/uL (ref 150–400)
RDW: 13.5 % (ref 11.5–15.5)

## 2013-07-29 LAB — POCT URINALYSIS DIPSTICK
Nitrite, UA: NEGATIVE
Spec Grav, UA: 1.02
Urobilinogen, UA: NEGATIVE
pH, UA: 6

## 2013-07-29 NOTE — Progress Notes (Signed)
Pulse- 96 Plans to breast feed.  Counseled re: childbirth education/TDAP.  Schedule U/S next visit.

## 2013-07-29 NOTE — Patient Instructions (Signed)
Intrauterine Device Information An intrauterine device (IUD) is inserted into your uterus to prevent pregnancy. There are two types of IUDs available:   Copper IUD This type of IUD is wrapped in copper wire and is placed inside the uterus. Copper makes the uterus and fallopian tubes produce a fluid that kills sperm. The copper IUD can stay in place for 10 years.  Hormone IUD This type of IUD contains the hormone progestin (synthetic progesterone). The hormone thickens the cervical mucus and prevents sperm from entering the uterus. It also thins the uterine lining to prevent implantation of a fertilized egg. The hormone can weaken or kill the sperm that get into the uterus. One type of hormone IUD can stay in place for 5 years, and another type can stay in place for 3 years. Your health care provider will make sure you are a good candidate for a contraceptive IUD. Discuss with your health care provider the possible side effects.  ADVANTAGES OF AN INTRAUTERINE DEVICE  IUDs are highly effective, reversible, long acting, and low maintenance.   There are no estrogen-related side effects.   An IUD can be used when breastfeeding.   IUDs are not associated with weight gain.   The copper IUD works immediately after insertion.   The hormone IUD works right away if inserted within 7 days of your period starting. You will need to use a backup method of birth control for 7 days if the hormone IUD is inserted at any other time in your cycle.  The copper IUD does not interfere with your female hormones.   The hormone IUD can make heavy menstrual periods lighter and decrease cramping.   The hormone IUD can be used for 3 or 5 years.   The copper IUD can be used for 10 years. DISADVANTAGES OF AN INTRAUTERINE DEVICE  The hormone IUD can be associated with irregular bleeding patterns.   The copper IUD can make your menstrual flow heavier and more painful.   You may experience cramping and  vaginal bleeding after insertion.  Document Released: 08/13/2004 Document Revised: 05/12/2013 Document Reviewed: 02/28/2013 [facility] Patient Information 2014 [place]. Etonogestrel implant What is this medicine? ETONOGESTREL (et oh noe JES trel) is a contraceptive (birth control) device. It is used to prevent pregnancy. It can be used for up to 3 years. This medicine may be used for other purposes; ask your health care provider or pharmacist if you have questions. COMMON BRAND NAME(S): Implanon, Nexplanon  What should I tell my health care provider before I take this medicine? They need to know if you have any of these conditions: -abnormal vaginal bleeding -blood vessel disease or blood clots -cancer of the breast, cervix, or liver -depression -diabetes -gallbladder disease -headaches -heart disease or recent heart attack -high blood pressure -high cholesterol -kidney disease -liver disease -renal disease -seizures -tobacco smoker -an unusual or allergic reaction to etonogestrel, other hormones, anesthetics or antiseptics, medicines, foods, dyes, or preservatives -pregnant or trying to get pregnant -breast-feeding How should I use this medicine? This device is inserted just under the skin on the inner side of your upper arm by a health care professional. Talk to your pediatrician regarding the use of this medicine in children. Special care may be needed. Overdosage: If you think you've taken too much of this medicine contact a poison control center or emergency room at once. Overdosage: If you think you have taken too much of this medicine contact a poison control center or emergency room at  once. NOTE: This medicine is only for you. Do not share this medicine with others. What if I miss a dose? This does not apply. What may interact with this medicine? Do not take this medicine with any of the following medications: -amprenavir -bosentan -fosamprenavir This medicine  may also interact with the following medications: -barbiturate medicines for inducing sleep or treating seizures -certain medicines for fungal infections like ketoconazole and itraconazole -griseofulvin -medicines to treat seizures like carbamazepine, felbamate, oxcarbazepine, phenytoin, topiramate -modafinil -phenylbutazone -rifampin -some medicines to treat HIV infection like atazanavir, indinavir, lopinavir, nelfinavir, tipranavir, ritonavir -St. John's wort This list may not describe all possible interactions. Give your health care provider a list of all the medicines, herbs, non-prescription drugs, or dietary supplements you use. Also tell them if you smoke, drink alcohol, or use illegal drugs. Some items may interact with your medicine. What should I watch for while using this medicine? This product does not protect you against HIV infection (AIDS) or other sexually transmitted diseases. You should be able to feel the implant by pressing your fingertips over the skin where it was inserted. Tell your doctor if you cannot feel the implant. What side effects may I notice from receiving this medicine? Side effects that you should report to your doctor or health care professional as soon as possible: -allergic reactions like skin rash, itching or hives, swelling of the face, lips, or tongue -breast lumps -changes in vision -confusion, trouble speaking or understanding -dark urine -depressed mood -general ill feeling or flu-like symptoms -light-colored stools -loss of appetite, nausea -right upper belly pain -severe headaches -severe pain, swelling, or tenderness in the abdomen -shortness of breath, chest pain, swelling in a leg -signs of pregnancy -sudden numbness or weakness of the face, arm or leg -trouble walking, dizziness, loss of balance or coordination -unusual vaginal bleeding, discharge -unusually weak or tired -yellowing of the eyes or skin Side effects that usually do  not require medical attention (Report these to your doctor or health care professional if they continue or are bothersome.): -acne -breast pain -changes in weight -cough -fever or chills -headache -irregular menstrual bleeding -itching, burning, and vaginal discharge -pain or difficulty passing urine -sore throat This list may not describe all possible side effects. Call your doctor for medical advice about side effects. You may report side effects to FDA at 1-800-FDA-1088. Where should I keep my medicine? This drug is given in a hospital or clinic and will not be stored at home. NOTE: This sheet is a summary. It may not cover all possible information. If you have questions about this medicine, talk to your doctor, pharmacist, or health care provider.  2014, Elsevier/Gold Standard. (2012-03-16 15:37:45) Tetanus, Diphtheria (Td) Vaccine What You Need to Know WHY GET VACCINATED? Tetanus  and diphtheria are very serious diseases. They are rare in the [place] today, but people who do become infected often have severe complications. Td vaccine is used to protect adolescents and adults from both of these diseases. Both tetanus and diphtheria are infections caused by bacteria. Diphtheria spreads from person to person through coughing or sneezing. Tetanus-causing bacteria enter the body through cuts, scratches, or wounds. TETANUS (Lockjaw) causes painful muscle tightening and stiffness, usually all over the body.  It can lead to tightening of muscles in the head and neck so you can't open your mouth, swallow, or sometimes even breathe. Tetanus kills about 1 out of every 5 people who are infected. DIPHTHERIA can cause a thick coating to form in  the back of the throat.  It can lead to breathing problems, paralysis, heart failure, and death. Before vaccines, the [place] saw as many as 200,000 cases a year of diphtheria and hundreds of cases of tetanus. Since vaccination began, cases of  both diseases have dropped by about 99%. TD VACCINE Td vaccine can protect adolescents and adults from tetanus and diphtheria. Td is usually given as a booster dose every 10 years but it can also be given earlier after a severe and dirty wound or burn. Your doctor can give you more information. Td may safely be given at the same time as other vaccines. SOME PEOPLE SHOULD NOT GET THIS VACCINE  If you ever had a life-threatening allergic reaction after a dose of any tetanus or diphtheria containing vaccine, OR if you have a severe allergy to any part of this vaccine, you should not get Td. Tell your doctor if you have any severe allergies.  Talk to your doctor if you:  have epilepsy or another nervous system problem,  had severe pain or swelling after any vaccine containing diphtheria or tetanus,  ever had Guillain Barr Syndrome (GBS),  aren't feeling well on the day the shot is scheduled. RISKS OF A VACCINE REACTION With a vaccine, like any medicine, there is a chance of side effects. These are usually mild and go away on their own. Serious side effects are also possible, but are very rare. Most people who get Td vaccine do not have any problems with it. Mild Problems  following Td (Did not interfere with activities)  Pain where the shot was given (about 8 people in 10)  Redness or swelling where the shot was given (about 1 person in 3)  Mild fever (about 1 person in 15)  Headache or Tiredness (uncommon) Moderate Problems following Td (Interfered with activities, but did not require medical attention)  Fever over 102 F (38.9 C) (rare) Severe Problems  following Td (Unable to perform usual activities; required medical attention)  Swelling, severe pain, bleeding, or redness in the arm where the shot was given (rare). Problems that could happen after any vaccine:  Brief fainting spells can happen after any medical procedure, including vaccination. Sitting or lying down for  about 15 minutes can help prevent fainting, and injuries caused by a fall. Tell your doctor if you feel dizzy, or have vision changes or ringing in the ears.  Severe shoulder pain and reduced range of motion in the arm where a shot was given can happen, very rarely, after a vaccination.  Severe allergic reactions from a vaccine are very rare, estimated at less than 1 in a million doses. If one were to occur, it would usually be within a few minutes to a few hours after the vaccination. WHAT IF THERE IS A SERIOUS REACTION? What should I look for?  Look for anything that concerns you, such as signs of a severe allergic reaction, very high fever, or behavior changes. Signs of a severe allergic reaction can include hives, swelling of the face and throat, difficulty breathing, a fast heartbeat, dizziness, and weakness. These would usually start a few minutes to a few hours after the vaccination. What should I do?  If you think it is a severe allergic reaction or other emergency that can't wait, call 911 or get the person to the nearest hospital. Otherwise, call your doctor.  Afterward, the reaction should be reported to the Vaccine Adverse Event Reporting System (VAERS). Your doctor might file this report,  or, you can do it yourself through the VAERS website or by calling 1-(219)434-2453. VAERS is only for reporting reactions. They do not give medical advice. THE NATIONAL VACCINE INJURY COMPENSATION PROGRAM The National Vaccine Injury Compensation Program (VICP) is a federal program that was created to compensate people who may have been injured by certain vaccines. Persons who believe they may have been injured by a vaccine can learn about the program and about filing a claim by calling 1-860-240-0532 or visiting the [facility] website. HOW CAN I LEARN MORE?  Ask your doctor.  Contact your local or state health department.  Contact the Centers for Disease Control and Prevention (CDC):  Call  763-271-2580 (1-800-CDC-INFO)  Visit CDC's vaccines website CDC Td Vaccine Interim VIS (10/27/12) Document Released: 07/07/2006 Document Revised: 01/04/2013 Document Reviewed: 12/30/2012 [facility] Patient Information 2014 [place], [place].

## 2013-07-30 LAB — RPR

## 2013-07-30 LAB — GLUCOSE TOLERANCE, 2 HOURS W/ 1HR
Glucose, 2 hour: 93 mg/dL (ref 70–139)
Glucose, Fasting: 68 mg/dL — ABNORMAL LOW (ref 70–99)

## 2013-08-12 ENCOUNTER — Ambulatory Visit (INDEPENDENT_AMBULATORY_CARE_PROVIDER_SITE_OTHER): Payer: Managed Care, Other (non HMO) | Admitting: Obstetrics & Gynecology

## 2013-08-12 VITALS — BP 108/56 | Temp 97.5°F | Wt 277.0 lb

## 2013-08-12 DIAGNOSIS — Z1389 Encounter for screening for other disorder: Secondary | ICD-10-CM

## 2013-08-12 DIAGNOSIS — Z3400 Encounter for supervision of normal first pregnancy, unspecified trimester: Secondary | ICD-10-CM

## 2013-08-12 DIAGNOSIS — Z3402 Encounter for supervision of normal first pregnancy, second trimester: Secondary | ICD-10-CM

## 2013-08-12 LAB — POCT URINALYSIS DIPSTICK
Bilirubin, UA: NEGATIVE
Ketones, UA: NEGATIVE
Spec Grav, UA: 1.015
Urobilinogen, UA: 4
pH, UA: 7

## 2013-08-12 NOTE — Progress Notes (Signed)
Pulse 106 Pt doing well.

## 2013-08-17 ENCOUNTER — Other Ambulatory Visit: Payer: Self-pay | Admitting: Obstetrics

## 2013-08-17 DIAGNOSIS — O3663X1 Maternal care for excessive fetal growth, third trimester, fetus 1: Secondary | ICD-10-CM

## 2013-08-18 ENCOUNTER — Encounter: Payer: Self-pay | Admitting: Obstetrics & Gynecology

## 2013-08-18 LAB — US OB DETAIL + 14 WK

## 2013-08-24 ENCOUNTER — Ambulatory Visit (INDEPENDENT_AMBULATORY_CARE_PROVIDER_SITE_OTHER): Payer: Managed Care, Other (non HMO)

## 2013-08-24 ENCOUNTER — Encounter: Payer: Self-pay | Admitting: Obstetrics & Gynecology

## 2013-08-24 ENCOUNTER — Other Ambulatory Visit: Payer: Self-pay | Admitting: Obstetrics

## 2013-08-24 DIAGNOSIS — O3663X1 Maternal care for excessive fetal growth, third trimester, fetus 1: Secondary | ICD-10-CM

## 2013-08-24 DIAGNOSIS — O3660X0 Maternal care for excessive fetal growth, unspecified trimester, not applicable or unspecified: Secondary | ICD-10-CM

## 2013-08-24 LAB — US OB DETAIL + 14 WK

## 2013-08-26 ENCOUNTER — Encounter: Payer: Managed Care, Other (non HMO) | Admitting: Obstetrics & Gynecology

## 2013-09-14 ENCOUNTER — Ambulatory Visit (INDEPENDENT_AMBULATORY_CARE_PROVIDER_SITE_OTHER): Payer: Managed Care, Other (non HMO) | Admitting: Advanced Practice Midwife

## 2013-09-14 ENCOUNTER — Encounter: Payer: Self-pay | Admitting: Obstetrics & Gynecology

## 2013-09-14 VITALS — BP 116/78 | Temp 98.7°F | Wt 279.0 lb

## 2013-09-14 DIAGNOSIS — Z3403 Encounter for supervision of normal first pregnancy, third trimester: Secondary | ICD-10-CM

## 2013-09-14 DIAGNOSIS — Z3400 Encounter for supervision of normal first pregnancy, unspecified trimester: Secondary | ICD-10-CM

## 2013-09-14 LAB — POCT URINALYSIS DIPSTICK
Nitrite, UA: NEGATIVE
Protein, UA: NEGATIVE
Spec Grav, UA: 1.015
Urobilinogen, UA: NEGATIVE
pH, UA: 7

## 2013-09-14 NOTE — Progress Notes (Signed)
Subjective: Lisa Rubio is a 22 y.o. at 34 weeks  Patient denies vaginal leaking of fluid or bleeding, denies contractions.  Reports positive fetal movment.  Denies concerns today.  Objective: Filed Vitals:   09/14/13 1129  BP: 116/78  Temp: 98.7 F (37.1 C)   150 FHR 34 Fundal Height Fetal Position cephalic  Assessment: Patient Active Problem List   Diagnosis Date Noted  . Supervision of normal first pregnancy 07/29/2013  . Obesity complicating pregnancy 07/29/2013  . Asymptomatic bacteriuria 03/19/2013  . 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 Unspecified vitamin D deficiency 03/19/2013    Plan: Patient to return to clinic in 2 weeks Plan to treat in labor, GBS not indicated NV. Reviewed w/ patient today. Reviewed warning signs in pregnancy. Patient to call with concerns PRN. Reviewed triage location.  20 min spent with patient greater than 80% spent in counseling and coordination of care.  Tamari Busic Wilson Singer CNM

## 2013-09-14 NOTE — Progress Notes (Signed)
HR - 101 Pt in office today for routine OB visit, reports continued low back pain, states flexeril not relieving

## 2013-09-23 NOTE — L&D Delivery Note (Signed)
Delivery Note At 6:02 PM a viable female was delivered via Vaginal, Spontaneous Delivery (Presentation: Right Occiput Anterior).  APGAR: 8, 9; weight 7 lb 11 oz (3487 g).   Placenta status: Intact, Spontaneous.  Cord: 3 vessels with the following complications: None.  Cord pH: none  Anesthesia: Epidural  Episiotomy: None Lacerations: 2nd degree Suture Repair: 2.0 chromic Est. Blood Loss (mL): 300  Mom to postpartum.  Baby to Couplet care / Skin to Skin.  [name],[name] 11/05/2013, 8:24 AM

## 2013-09-28 ENCOUNTER — Ambulatory Visit (INDEPENDENT_AMBULATORY_CARE_PROVIDER_SITE_OTHER): Payer: Managed Care, Other (non HMO) | Admitting: Advanced Practice Midwife

## 2013-09-28 VITALS — BP 121/79 | Temp 97.9°F | Wt 280.0 lb

## 2013-09-28 DIAGNOSIS — Z3400 Encounter for supervision of normal first pregnancy, unspecified trimester: Secondary | ICD-10-CM

## 2013-09-28 DIAGNOSIS — Z3403 Encounter for supervision of normal first pregnancy, third trimester: Secondary | ICD-10-CM

## 2013-09-28 LAB — POCT URINALYSIS DIPSTICK
Blood, UA: 250
Spec Grav, UA: 1.015
pH, UA: 7

## 2013-09-28 NOTE — Progress Notes (Signed)
Pulse 99 Pt would like to discuss +GBS result.  Pt states that she has had some irregular contractions.  Pt states that she is moving over an hour away this weekend.  Pt advised to contact hospital in which she plans to deliver and let office now where records are to be sent.

## 2013-09-28 NOTE — Progress Notes (Signed)
Subjective: Hughie ClossShykeila Alaniz is a 23 y.o. at 36 weeks by LMP  Patient denies vaginal leaking of fluid or bleeding, denies contractions.  Reports positive fetal movment.  Denies concerns today.  Patient reports she lives an hour and half away but goes to school here. Considering delivering closer to her house. I recommend if she chooses to do this to establish care with a provider and provide them her prenatal records. I also encouraged the patient to stay in our practice for continuity of care as we have all of her prenatal information and have already established care with her.  Objective: Filed Vitals:   09/28/13 1102  BP: 121/79  Temp: 97.9 F (36.6 C)   140 FHR 37 Fundal Height Fetal Position cephalic  Assessment: Patient Active Problem List   Diagnosis Date Noted  . Supervision of normal first pregnancy 07/29/2013  . Obesity complicating pregnancy 07/29/2013  . Asymptomatic bacteriuria 03/19/2013  . 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 Unspecified vitamin D deficiency 03/19/2013    Plan: Patient to return to clinic in 1 weeks Encouraged patient to either stay with our practice or establish care near her home ASAP. Encouraged patient to provide prenatal records to new provider. Reviewed warning signs in pregnancy. Patient to call with concerns PRN. Reviewed triage location.  20 min spent with patient greater than 80% spent in counseling and coordination of care.   Lashunda Greis Wilson SingerWren CNM

## 2013-10-07 ENCOUNTER — Encounter: Payer: Self-pay | Admitting: Obstetrics

## 2013-10-07 ENCOUNTER — Ambulatory Visit (INDEPENDENT_AMBULATORY_CARE_PROVIDER_SITE_OTHER): Payer: Managed Care, Other (non HMO) | Admitting: Obstetrics

## 2013-10-07 VITALS — BP 130/76 | Temp 98.8°F | Wt 280.0 lb

## 2013-10-07 DIAGNOSIS — Z3400 Encounter for supervision of normal first pregnancy, unspecified trimester: Secondary | ICD-10-CM

## 2013-10-07 DIAGNOSIS — R8290 Unspecified abnormal findings in urine: Secondary | ICD-10-CM

## 2013-10-07 DIAGNOSIS — R82998 Other abnormal findings in urine: Secondary | ICD-10-CM

## 2013-10-07 LAB — POCT URINALYSIS DIPSTICK
Bilirubin, UA: NEGATIVE
GLUCOSE UA: NEGATIVE
Ketones, UA: NEGATIVE
Nitrite, UA: NEGATIVE
SPEC GRAV UA: 1.02
UROBILINOGEN UA: NEGATIVE
pH, UA: 5

## 2013-10-07 NOTE — Progress Notes (Signed)
HR - 120 Pt in office today for routine OB visit, pt reports that she moved to [place] recently

## 2013-10-09 LAB — CULTURE, OB URINE

## 2013-10-14 ENCOUNTER — Encounter: Payer: Self-pay | Admitting: Obstetrics

## 2013-10-14 ENCOUNTER — Ambulatory Visit (INDEPENDENT_AMBULATORY_CARE_PROVIDER_SITE_OTHER): Payer: Managed Care, Other (non HMO) | Admitting: Obstetrics

## 2013-10-14 ENCOUNTER — Encounter: Payer: Self-pay | Admitting: Obstetrics & Gynecology

## 2013-10-14 VITALS — BP 130/79 | Temp 97.6°F | Wt 284.0 lb

## 2013-10-14 DIAGNOSIS — Z3400 Encounter for supervision of normal first pregnancy, unspecified trimester: Secondary | ICD-10-CM

## 2013-10-14 LAB — POCT URINALYSIS DIPSTICK
BILIRUBIN UA: NEGATIVE
Ketones, UA: NEGATIVE
Nitrite, UA: NEGATIVE
SPEC GRAV UA: 1.015
Urobilinogen, UA: NEGATIVE
pH, UA: 6.5

## 2013-10-14 LAB — OB RESULTS CONSOLE GBS: STREP GROUP B AG: POSITIVE

## 2013-10-14 NOTE — Progress Notes (Signed)
Pulse 98 [facility]. Pt states that she thought her water had broke.  Pt was 1cm and intact, d/c home.

## 2013-10-16 LAB — CULTURE, OB URINE: Colony Count: 25000

## 2013-10-21 ENCOUNTER — Ambulatory Visit (INDEPENDENT_AMBULATORY_CARE_PROVIDER_SITE_OTHER): Payer: Managed Care, Other (non HMO) | Admitting: Obstetrics

## 2013-10-21 VITALS — BP 116/73 | Temp 98.5°F | Wt 280.0 lb

## 2013-10-21 DIAGNOSIS — Z3400 Encounter for supervision of normal first pregnancy, unspecified trimester: Secondary | ICD-10-CM

## 2013-10-21 LAB — POCT URINALYSIS DIPSTICK
BILIRUBIN UA: NEGATIVE
Ketones, UA: NEGATIVE
NITRITE UA: NEGATIVE
Spec Grav, UA: 1.015
Urobilinogen, UA: NEGATIVE
pH, UA: 5

## 2013-10-21 NOTE — Progress Notes (Signed)
Pulse: 105 Patient is having some lower back pain. Patient states the pain is mainly when she lays flat on her back. Patient would like to know when she would be induced if she doesn't have the baby by her due date Saturday.

## 2013-10-25 ENCOUNTER — Encounter: Payer: Self-pay | Admitting: Obstetrics

## 2013-10-28 ENCOUNTER — Encounter: Payer: Self-pay | Admitting: Obstetrics

## 2013-10-28 ENCOUNTER — Encounter: Payer: Managed Care, Other (non HMO) | Admitting: Obstetrics

## 2013-10-28 ENCOUNTER — Ambulatory Visit (INDEPENDENT_AMBULATORY_CARE_PROVIDER_SITE_OTHER): Payer: Managed Care, Other (non HMO) | Admitting: Obstetrics

## 2013-10-28 VITALS — BP 124/77 | Temp 97.9°F | Wt 290.0 lb

## 2013-10-28 DIAGNOSIS — Z3400 Encounter for supervision of normal first pregnancy, unspecified trimester: Secondary | ICD-10-CM

## 2013-10-28 LAB — POCT URINALYSIS DIPSTICK
Bilirubin, UA: NEGATIVE
Ketones, UA: NEGATIVE
LEUKOCYTES UA: NEGATIVE
NITRITE UA: NEGATIVE
Spec Grav, UA: 1.015
UROBILINOGEN UA: NEGATIVE
pH, UA: 6

## 2013-10-28 NOTE — Progress Notes (Signed)
Pulse: 103 Patient states she went to the doctor and they told her she had group B strep and gave her amoxicillin. Patient denies any concerns.

## 2013-10-29 ENCOUNTER — Telehealth (HOSPITAL_COMMUNITY): Payer: Self-pay | Admitting: *Deleted

## 2013-10-29 NOTE — Telephone Encounter (Signed)
Preadmission screen  

## 2013-11-02 ENCOUNTER — Encounter (HOSPITAL_COMMUNITY): Payer: Self-pay

## 2013-11-02 ENCOUNTER — Inpatient Hospital Stay (HOSPITAL_COMMUNITY)
Admission: RE | Admit: 2013-11-02 | Discharge: 2013-11-06 | DRG: 775 | Disposition: A | Discharge status: Home / Self Care | Payer: Managed Care, Other (non HMO) | Source: Ambulatory Visit | Attending: Obstetrics | Admitting: Obstetrics

## 2013-11-02 VITALS — BP 116/76 | HR 92 | Temp 97.4°F | Resp 20 | Ht 67.0 in | Wt 290.0 lb

## 2013-11-02 DIAGNOSIS — O99892 Other specified diseases and conditions complicating childbirth: Secondary | ICD-10-CM | POA: Diagnosis present

## 2013-11-02 DIAGNOSIS — E559 Vitamin D deficiency, unspecified: Secondary | ICD-10-CM

## 2013-11-02 DIAGNOSIS — Z22330 Carrier of Group B streptococcus: Secondary | ICD-10-CM

## 2013-11-02 DIAGNOSIS — O9903 Anemia complicating the puerperium: Secondary | ICD-10-CM | POA: Diagnosis not present

## 2013-11-02 DIAGNOSIS — O480 Post-term pregnancy: Principal | ICD-10-CM | POA: Diagnosis present

## 2013-11-02 DIAGNOSIS — O99210 Obesity complicating pregnancy, unspecified trimester: Secondary | ICD-10-CM

## 2013-11-02 DIAGNOSIS — Z3400 Encounter for supervision of normal first pregnancy, unspecified trimester: Secondary | ICD-10-CM

## 2013-11-02 DIAGNOSIS — D649 Anemia, unspecified: Secondary | ICD-10-CM | POA: Diagnosis not present

## 2013-11-02 DIAGNOSIS — R8271 Bacteriuria: Secondary | ICD-10-CM

## 2013-11-02 DIAGNOSIS — O99214 Obesity complicating childbirth: Secondary | ICD-10-CM

## 2013-11-02 DIAGNOSIS — O9989 Other specified diseases and conditions complicating pregnancy, childbirth and the puerperium: Secondary | ICD-10-CM

## 2013-11-02 DIAGNOSIS — E669 Obesity, unspecified: Secondary | ICD-10-CM | POA: Diagnosis present

## 2013-11-02 LAB — CBC
HEMATOCRIT: 32.9 % — AB (ref 36.0–46.0)
Hemoglobin: 10.6 g/dL — ABNORMAL LOW (ref 12.0–15.0)
MCH: 26.6 pg (ref 26.0–34.0)
MCHC: 32.2 g/dL (ref 30.0–36.0)
MCV: 82.5 fL (ref 78.0–100.0)
Platelets: 259 10*3/uL (ref 150–400)
RBC: 3.99 MIL/uL (ref 3.87–5.11)
RDW: 16.2 % — ABNORMAL HIGH (ref 11.5–15.5)
WBC: 9.4 10*3/uL (ref 4.0–10.5)

## 2013-11-02 LAB — TYPE AND SCREEN
ABO/RH(D): O POS
Antibody Screen: NEGATIVE

## 2013-11-02 LAB — RPR: RPR Ser Ql: NONREACTIVE

## 2013-11-02 MED ORDER — LACTATED RINGERS IV SOLN
INTRAVENOUS | Status: DC
  Administered 2013-11-02 – 2013-11-04 (×4): via INTRAVENOUS

## 2013-11-02 MED ORDER — MISOPROSTOL 25 MCG QUARTER TABLET
25.0000 ug | ORAL_TABLET | ORAL | Status: DC | PRN
  Administered 2013-11-02 – 2013-11-03 (×4): 25 ug via VAGINAL
  Filled 2013-11-02 (×4): qty 0.25

## 2013-11-02 MED ORDER — OXYTOCIN BOLUS FROM INFUSION: 500.0000 mL | INTRAVENOUS | Status: DC

## 2013-11-02 MED ORDER — KETOROLAC TROMETHAMINE 30 MG/ML IJ SOLN
INTRAMUSCULAR | Status: AC
  Filled 2013-11-02: qty 1

## 2013-11-02 MED ORDER — ONDANSETRON HCL 4 MG/2ML IJ SOLN: 4.0000 mg | Freq: Four times a day (QID) | INTRAMUSCULAR | Status: DC | PRN

## 2013-11-02 MED ORDER — IBUPROFEN 600 MG PO TABS
600.0000 mg | ORAL_TABLET | Freq: Four times a day (QID) | ORAL | Status: DC | PRN
  Administered 2013-11-04: 600 mg via ORAL
  Filled 2013-11-02: qty 1

## 2013-11-02 MED ORDER — OXYTOCIN 40 UNITS IN LACTATED RINGERS INFUSION - SIMPLE MED: 62.5000 mL/h | INTRAVENOUS | Status: DC

## 2013-11-02 MED ORDER — PROMETHAZINE HCL 25 MG/ML IJ SOLN
INTRAMUSCULAR | Status: AC
  Filled 2013-11-02: qty 1

## 2013-11-02 MED ORDER — OXYCODONE-ACETAMINOPHEN 5-325 MG PO TABS: 1.0000 | ORAL_TABLET | ORAL | Status: DC | PRN

## 2013-11-02 MED ORDER — CITRIC ACID-SODIUM CITRATE 334-500 MG/5ML PO SOLN: 30.0000 mL | ORAL | Status: DC | PRN

## 2013-11-02 MED ORDER — DEXTROSE 5 % IV SOLN
2.5000 10*6.[IU] | INTRAVENOUS | Status: DC
  Filled 2013-11-02 (×10): qty 2.5

## 2013-11-02 MED ORDER — ACETAMINOPHEN 325 MG PO TABS: 650.0000 mg | ORAL_TABLET | ORAL | Status: DC | PRN

## 2013-11-02 MED ORDER — PENICILLIN G POTASSIUM 5000000 UNITS IJ SOLR
5.0000 10*6.[IU] | Freq: Once | INTRAVENOUS | Status: DC
  Filled 2013-11-02: qty 5

## 2013-11-02 MED ORDER — TERBUTALINE SULFATE 1 MG/ML IJ SOLN: 0.2500 mg | Freq: Once | INTRAMUSCULAR | Status: AC | PRN

## 2013-11-02 MED ORDER — LIDOCAINE HCL (PF) 1 % IJ SOLN
30.0000 mL | INTRAMUSCULAR | Status: DC | PRN
  Administered 2013-11-04: 30 mL via SUBCUTANEOUS
  Filled 2013-11-02: qty 30

## 2013-11-02 MED ORDER — LACTATED RINGERS IV SOLN
500.0000 mL | INTRAVENOUS | Status: DC | PRN
Start: 2013-11-02 — End: 2013-11-04
  Administered 2013-11-04: 500 mL via INTRAVENOUS

## 2013-11-02 NOTE — Progress Notes (Signed)
Lisa Rubio is a 23 y.o. G1P0 at 565w3d by LMP admitted for induction of labor due to Post dates. Due date 10/23/2013.  Subjective: Patient comfortable, no concerns. Denies labor. + fetal movement.  Objective: BP 135/71  Pulse 108  Temp(Src) 98.3 F (36.8 C) (Oral)  Resp 18  Ht 5\' 7"  (1.702 m)  Wt 290 lb (131.543 kg)  BMI 45.41 kg/m2  LMP 01/16/2013      FHT:  FHR: 135 bpm, variability: moderate,  accelerations:  Present,  decelerations:  Absent UC:   none SVE:   Dilation: 1 Effacement (%): Thick Station: -3 Exam by:: a. Biviana Saddler, cnm  Labs: Lab Results  Component Value Date   WBC 9.4 11/02/2013   HGB 10.6* 11/02/2013   HCT 32.9* 11/02/2013   MCV 82.5 11/02/2013   PLT 259 11/02/2013    Assessment / Plan: Cytotec induction of labor. Explained induction and plan of care w/ patient and her mother.   Labor: Progressing normally Preeclampsia:  NA Fetal Wellbeing:  Category I Pain Control:  Labor support without medications I/D:  n/a Anticipated MOD:  NSVD  Lisa Rubio 11/02/2013, 1:07 PM

## 2013-11-02 NOTE — Progress Notes (Signed)
Cytotec placed.

## 2013-11-02 NOTE — H&P (Signed)
Lisa Rubio is a 23 y.o. female presenting for IOL. Maternal Medical History:  Reason for admission: 23 yo G1.  EDC 10-23-13.  Presents for IOL for postdates.  Fetal activity: Perceived fetal activity is normal.   Last perceived fetal movement was within the past hour.    Prenatal complications: no prenatal complications Prenatal Complications - Diabetes: none.    OB History   Grav Para Term Preterm Abortions TAB SAB Ect Mult Living   1              Past Medical History  Diagnosis Date  . Bronchitis   . Headache(784.0)   . Fracture     left ankle   Past Surgical History  Procedure Laterality Date  . No past surgeries     Family History: family history includes Anemia in her mother; Cancer in her maternal aunt and maternal uncle; Diabetes in her maternal grandmother; Migraines in her mother. Social History:  reports that she has never smoked. She has never used smokeless tobacco. She reports that she does not drink alcohol or use illicit drugs.   Prenatal Transfer Tool  Maternal Diabetes: No Genetic Screening: Normal Maternal Ultrasounds/Referrals: Normal Fetal Ultrasounds or other Referrals:  None Maternal Substance Abuse:  No Significant Maternal Medications:  None Significant Maternal Lab Results:  None Other Comments:  None  Review of Systems  All other systems reviewed and are negative.      Blood pressure 135/71, pulse 108, temperature 98.3 F (36.8 C), temperature source Oral, resp. rate 20, height 5\' 7"  (1.702 m), weight 290 lb (131.543 kg), last menstrual period 01/16/2013. Maternal Exam:  Abdomen: Patient reports no abdominal tenderness. Fetal presentation: vertex  Introitus: Normal vulva. Normal vagina.  Cervix: Cervix evaluated by digital exam.     Physical Exam  Nursing note and vitals reviewed. Constitutional: She is oriented to person, place, and time. She appears well-developed and well-nourished.  HENT:  Head: Normocephalic and  atraumatic.  Eyes: Conjunctivae are normal. Pupils are equal, round, and reactive to light.  Neck: Normal range of motion. Neck supple.  Cardiovascular: Normal rate and regular rhythm.   Respiratory: Effort normal and breath sounds normal.  GI: Soft.  Genitourinary: Vagina normal and uterus normal.  Musculoskeletal: Normal range of motion.  Neurological: She is alert and oriented to person, place, and time.  Skin: Skin is warm and dry.  Psychiatric: She has a normal mood and affect. Her behavior is normal. Judgment and thought content normal.    Prenatal labs: ABO, Rh: O/POS/-- (06/23 1356) Antibody: NEG (06/23 1356) Rubella: 2.78 (06/23 1356) RPR: NON REAC (11/06 1157)  HBsAg: NEGATIVE (06/23 1356)  HIV: NON REACTIVE (11/06 1157)  GBS: Positive (01/22 0000)   Assessment/Plan: 41 weeks.  2 stage IOL.   Traycen Goyer A 11/02/2013, 9:03 AM

## 2013-11-03 MED ORDER — BUTORPHANOL TARTRATE 1 MG/ML IJ SOLN
INTRAMUSCULAR | Status: AC
  Administered 2013-11-03: 2 mg via INTRAVENOUS
  Filled 2013-11-03: qty 2

## 2013-11-03 MED ORDER — PENICILLIN G POTASSIUM 5000000 UNITS IJ SOLR
5.0000 10*6.[IU] | Freq: Once | INTRAVENOUS | Status: AC
  Administered 2013-11-04: 5 10*6.[IU] via INTRAVENOUS
  Filled 2013-11-03: qty 5

## 2013-11-03 MED ORDER — OXYTOCIN 40 UNITS IN LACTATED RINGERS INFUSION - SIMPLE MED
1.0000 m[IU]/min | INTRAVENOUS | Status: DC
  Administered 2013-11-03: 2 m[IU]/min via INTRAVENOUS
  Administered 2013-11-04: 4 m[IU]/min via INTRAVENOUS
  Administered 2013-11-04: 2 m[IU]/min via INTRAVENOUS
  Filled 2013-11-03: qty 1000

## 2013-11-03 MED ORDER — PENICILLIN G POTASSIUM 5000000 UNITS IJ SOLR
2.5000 10*6.[IU] | INTRAVENOUS | Status: DC
  Administered 2013-11-04 (×3): 2.5 10*6.[IU] via INTRAVENOUS
  Filled 2013-11-03 (×7): qty 2.5

## 2013-11-03 MED ORDER — BUTORPHANOL TARTRATE 1 MG/ML IJ SOLN
2.0000 mg | INTRAMUSCULAR | Status: DC | PRN
  Administered 2013-11-03 – 2013-11-04 (×4): 2 mg via INTRAVENOUS
  Filled 2013-11-03 (×4): qty 2

## 2013-11-03 NOTE — Progress Notes (Signed)
Patient ID: Lisa ClossShykeila Rubio, female   DOB: November 30, 1990, 23 y.o.   MRN: [id number] Lisa Rubio is a 23 y.o. G1P0 at 7639w3d by LMP admitted for induction of labor due to Post dates. Due date 10/23/2013.  Subjective: Patient uncomfortable  Objective: BP 136/76  Pulse 112  Temp(Src) 97.5 F (36.4 C) (Oral)  Resp 18  Ht 5\' 7"  (1.702 m)  Wt 290 lb (131.543 kg)  BMI 45.41 kg/m2  SpO2 94%  LMP 01/16/2013      FHT:  FHR: 135 bpm, variability: moderate,  accelerations:  Present,  decelerations:  Absent UC:   none SVE:   Dilation: 2 Effacement (%): 100 Station: -1 Exam by:: Marine scientistHall RN    Assessment / Plan: Early labor Labor: Progressing normally Preeclampsia:  NA Fetal Wellbeing:  Category I Pain Control:  Labor support without medications I/D:  n/a Anticipated MOD:  NSVD  JACKSON-MOORE,Marijane Trower A 11/03/2013, 1:33 PM

## 2013-11-03 NOTE — Progress Notes (Signed)
Physician updated on patient status. Pitocin to be discontinued and restarted at 2300. At that time antibiotic of PCN to start. Pt to eat regular diet now and shower. Patient and family informed of plan of care and agreeable. tjh

## 2013-11-04 ENCOUNTER — Encounter (HOSPITAL_COMMUNITY): Payer: Self-pay

## 2013-11-04 ENCOUNTER — Encounter (HOSPITAL_COMMUNITY): Payer: Managed Care, Other (non HMO) | Admitting: Anesthesiology

## 2013-11-04 ENCOUNTER — Inpatient Hospital Stay (HOSPITAL_COMMUNITY): Payer: Managed Care, Other (non HMO) | Admitting: Anesthesiology

## 2013-11-04 MED ORDER — PRENATAL MULTIVITAMIN CH
1.0000 | ORAL_TABLET | Freq: Every day | ORAL | Status: DC
  Administered 2013-11-05 – 2013-11-06 (×2): 1 via ORAL
  Filled 2013-11-04 (×2): qty 1

## 2013-11-04 MED ORDER — SODIUM BICARBONATE 8.4 % IV SOLN
INTRAVENOUS | Status: DC | PRN
  Administered 2013-11-04: 5 mL via EPIDURAL
  Administered 2013-11-04: 2 mL via EPIDURAL
  Administered 2013-11-04: 5 mL via EPIDURAL
  Administered 2013-11-04: 4 mL via EPIDURAL

## 2013-11-04 MED ORDER — FENTANYL 2.5 MCG/ML BUPIVACAINE 1/10 % EPIDURAL INFUSION (WH - ANES)
14.0000 mL/h | INTRAMUSCULAR | Status: DC | PRN
  Administered 2013-11-04: 14 mL/h via EPIDURAL
  Filled 2013-11-04 (×3): qty 125

## 2013-11-04 MED ORDER — FENTANYL 2.5 MCG/ML BUPIVACAINE 1/10 % EPIDURAL INFUSION (WH - ANES)
INTRAMUSCULAR | Status: DC | PRN
  Administered 2013-11-04: 14 mL/h via EPIDURAL

## 2013-11-04 MED ORDER — BENZOCAINE-MENTHOL 20-0.5 % EX AERO
1.0000 "application " | INHALATION_SPRAY | CUTANEOUS | Status: DC | PRN
  Filled 2013-11-04: qty 56

## 2013-11-04 MED ORDER — SIMETHICONE 80 MG PO CHEW: 80.0000 mg | CHEWABLE_TABLET | ORAL | Status: DC | PRN

## 2013-11-04 MED ORDER — ZOLPIDEM TARTRATE 5 MG PO TABS: 5.0000 mg | ORAL_TABLET | Freq: Every evening | ORAL | Status: DC | PRN

## 2013-11-04 MED ORDER — SENNOSIDES-DOCUSATE SODIUM 8.6-50 MG PO TABS
2.0000 | ORAL_TABLET | ORAL | Status: DC
  Administered 2013-11-04 – 2013-11-05 (×2): 2 via ORAL
  Filled 2013-11-04 (×2): qty 2

## 2013-11-04 MED ORDER — ONDANSETRON HCL 4 MG/2ML IJ SOLN: 4.0000 mg | INTRAMUSCULAR | Status: DC | PRN

## 2013-11-04 MED ORDER — LACTATED RINGERS IV SOLN: 500.0000 mL | Freq: Once | INTRAVENOUS | Status: DC

## 2013-11-04 MED ORDER — OXYTOCIN 40 UNITS IN LACTATED RINGERS INFUSION - SIMPLE MED: 62.5000 mL/h | INTRAVENOUS | Status: DC | PRN

## 2013-11-04 MED ORDER — FENTANYL CITRATE 0.05 MG/ML IJ SOLN
INTRAMUSCULAR | Status: DC | PRN
  Administered 2013-11-04: 100 ug via EPIDURAL

## 2013-11-04 MED ORDER — PHENYLEPHRINE 40 MCG/ML (10ML) SYRINGE FOR IV PUSH (FOR BLOOD PRESSURE SUPPORT): 80.0000 ug | PREFILLED_SYRINGE | INTRAVENOUS | Status: DC | PRN

## 2013-11-04 MED ORDER — METHYLERGONOVINE MALEATE 0.2 MG/ML IJ SOLN
0.2000 mg | Freq: Once | INTRAMUSCULAR | Status: AC
  Administered 2013-11-04: 0.2 mg via INTRAMUSCULAR

## 2013-11-04 MED ORDER — FENTANYL CITRATE 0.05 MG/ML IJ SOLN: 100.0000 ug | Freq: Once | INTRAMUSCULAR | Status: DC

## 2013-11-04 MED ORDER — LIDOCAINE HCL (PF) 1 % IJ SOLN
INTRAMUSCULAR | Status: DC | PRN
  Administered 2013-11-04 (×3): 5 mL

## 2013-11-04 MED ORDER — EPHEDRINE 5 MG/ML INJ
10.0000 mg | INTRAVENOUS | Status: DC | PRN
  Filled 2013-11-04: qty 4

## 2013-11-04 MED ORDER — DIBUCAINE 1 % RE OINT: 1.0000 "application " | TOPICAL_OINTMENT | RECTAL | Status: DC | PRN

## 2013-11-04 MED ORDER — TETANUS-DIPHTH-ACELL PERTUSSIS 5-2.5-18.5 LF-MCG/0.5 IM SUSP
0.5000 mL | Freq: Once | INTRAMUSCULAR | Status: AC
  Administered 2013-11-05: 0.5 mL via INTRAMUSCULAR
  Filled 2013-11-04: qty 0.5

## 2013-11-04 MED ORDER — DIPHENHYDRAMINE HCL 25 MG PO CAPS: 25.0000 mg | ORAL_CAPSULE | Freq: Four times a day (QID) | ORAL | Status: DC | PRN

## 2013-11-04 MED ORDER — EPHEDRINE 5 MG/ML INJ: 10.0000 mg | INTRAVENOUS | Status: DC | PRN

## 2013-11-04 MED ORDER — WITCH HAZEL-GLYCERIN EX PADS: 1.0000 "application " | MEDICATED_PAD | CUTANEOUS | Status: DC | PRN

## 2013-11-04 MED ORDER — OXYCODONE-ACETAMINOPHEN 5-325 MG PO TABS: 1.0000 | ORAL_TABLET | ORAL | Status: DC | PRN

## 2013-11-04 MED ORDER — IBUPROFEN 600 MG PO TABS
600.0000 mg | ORAL_TABLET | Freq: Four times a day (QID) | ORAL | Status: DC
  Administered 2013-11-04 – 2013-11-06 (×7): 600 mg via ORAL
  Filled 2013-11-04 (×7): qty 1

## 2013-11-04 MED ORDER — DIPHENHYDRAMINE HCL 50 MG/ML IJ SOLN: 12.5000 mg | INTRAMUSCULAR | Status: DC | PRN

## 2013-11-04 MED ORDER — LANOLIN HYDROUS EX OINT: TOPICAL_OINTMENT | CUTANEOUS | Status: DC | PRN

## 2013-11-04 MED ORDER — PHENYLEPHRINE 40 MCG/ML (10ML) SYRINGE FOR IV PUSH (FOR BLOOD PRESSURE SUPPORT)
80.0000 ug | PREFILLED_SYRINGE | INTRAVENOUS | Status: DC | PRN
  Filled 2013-11-04: qty 10

## 2013-11-04 MED ORDER — LACTATED RINGERS IV BOLUS (SEPSIS)
500.0000 mL | Freq: Once | INTRAVENOUS | Status: AC
  Administered 2013-11-04: 09:00:00 via INTRAVENOUS

## 2013-11-04 MED ORDER — FENTANYL CITRATE 0.05 MG/ML IJ SOLN
INTRAMUSCULAR | Status: AC
  Filled 2013-11-04: qty 2

## 2013-11-04 MED ORDER — ONDANSETRON HCL 4 MG PO TABS: 4.0000 mg | ORAL_TABLET | ORAL | Status: DC | PRN

## 2013-11-04 NOTE — Progress Notes (Signed)
MD updated on pt status.  Cont variables and early's. Pt with lots of rectal pressure Cont position changes.

## 2013-11-04 NOTE — Progress Notes (Signed)
MD calling to inform RN to have anesthesia come and redose epidural for comfort to allow laboring down.

## 2013-11-04 NOTE — Progress Notes (Signed)
CRNA notified and will come and administer Fentanyl 2cc

## 2013-11-04 NOTE — Progress Notes (Signed)
VE, discussion with MD, per MD attempt let 0pt push and see how well she does

## 2013-11-04 NOTE — Progress Notes (Signed)
Anesthesia at bedside to bolus epidural.

## 2013-11-04 NOTE — Progress Notes (Signed)
[facility] CRNA administering Lidociane 2% 5cc via epidural

## 2013-11-04 NOTE — Progress Notes (Signed)
Pt in a lot of pain with contractions and maternal HR up to 150's and 160

## 2013-11-04 NOTE — Progress Notes (Signed)
MD informed of pt status and requesting additional pain mgt.

## 2013-11-04 NOTE — Progress Notes (Signed)
Lisa Rubio is Rubio 23 y.o. G1P0 at 2319w5d by LMP admitted for induction of labor due to Post dates. Due date 10-23-13.  Subjective:   Objective: BP 131/77  Pulse 152  Temp(Src) 98.4 F (36.9 C) (Oral)  Resp 20  Ht 5\' 7"  (1.702 m)  Wt 290 lb (131.543 kg)  BMI 45.41 kg/m2  SpO2 100%  LMP 01/16/2013   Total I/O In: -  Out: 850 [Urine:850]  FHT:  FHR: 120-130 bpm, variability: moderate,  accelerations:  Present,  decelerations:  Present variable UC:   regular, every 2-3 minutes SVE:   Dilation: 10 Effacement (%): 80 Station: 0 Exam by:: Lynda RainwaterPamela Lawson RN   Labs: Lab Results  Component Value Date   WBC 9.4 11/02/2013   HGB 10.6* 11/02/2013   HCT 32.9* 11/02/2013   MCV 82.5 11/02/2013   PLT 259 11/02/2013    Assessment / Plan: Induction of labor due to postdates,  progressing well on pitocin  Labor: Progressing normally Preeclampsia:  n/Rubio Fetal Wellbeing:  Category I Pain Control:  Epidural I/D:  n/Rubio Anticipated MOD:  NSVD  Lisa Rubio 11/04/2013, 4:17 PM

## 2013-11-04 NOTE — Progress Notes (Signed)
PC from MD for update

## 2013-11-04 NOTE — Anesthesia Procedure Notes (Signed)
Epidural Patient location during procedure: OB  Staffing Anesthesiologist: Phillips GroutARIGNAN, Daniesha Driver Performed by: anesthesiologist   Preanesthetic Checklist Completed: patient identified, site marked, surgical consent, pre-op evaluation, timeout performed, IV checked, risks and benefits discussed and monitors and equipment checked  Epidural Patient position: sitting Prep: ChloraPrep Patient monitoring: heart rate, continuous pulse ox and blood pressure Approach: midline Injection technique: LOR saline  Needle:  Needle type: Tuohy  Needle gauge: 17 G Needle length: 9 cm and 9 Needle insertion depth: 8 cm Catheter type: closed end flexible Catheter size: 20 Guage Catheter at skin depth: 13 cm Test dose: negative  Assessment Events: blood not aspirated, injection not painful, no injection resistance, negative IV test and no paresthesia  Additional Notes   Patient tolerated the insertion well without complications.

## 2013-11-04 NOTE — Progress Notes (Signed)
MD updated on pt status, plan to let pt labor down.

## 2013-11-04 NOTE — Progress Notes (Signed)
Dr. Clearance CootsHarper called and notified of fetal heart rate change; attempts to keep baby on monitor externally and with [place], fetal tachycardia noted and early decels; new orders given to hold pitocin at this time; also notified that patient contracting q2-5 minutes

## 2013-11-04 NOTE — Progress Notes (Signed)
Pt going up in stirrups to cont pushing.

## 2013-11-04 NOTE — Anesthesia Preprocedure Evaluation (Signed)
Anesthesia Evaluation  Patient identified by MRN, date of birth, ID band Patient awake    Reviewed: Allergy & Precautions, H&P , NPO status , Patient's Chart, lab work & pertinent test results  History of Anesthesia Complications Negative for: history of anesthetic complications  Airway Mallampati: II  TM Distance: >3 FB Neck ROM: full    Dental no notable dental hx. (+) Teeth Intact   Pulmonary neg pulmonary ROS,  breath sounds clear to auscultation  Pulmonary exam normal       Cardiovascular negative cardio ROS  Rhythm:regular Rate:Normal     Neuro/Psych negative neurological ROS  negative psych ROS   GI/Hepatic negative GI ROS, Neg liver ROS,   Endo/Other  negative endocrine ROSMorbid obesity  Renal/GU negative Renal ROS  negative genitourinary   Musculoskeletal   Abdominal Normal abdominal exam  (+)   Peds  Hematology negative hematology ROS (+)   Anesthesia Other Findings   Reproductive/Obstetrics (+) Pregnancy                             Anesthesia Physical Anesthesia Plan  ASA: II  Anesthesia Plan: Epidural   Post-op Pain Management:    Induction:   Airway Management Planned:   Additional Equipment:   Intra-op Plan:   Post-operative Plan:   Informed Consent: I have reviewed the patients History and Physical, chart, labs and discussed the procedure including the risks, benefits and alternatives for the proposed anesthesia with the patient or authorized representative who has indicated his/her understanding and acceptance.     Plan Discussed with:   Anesthesia Plan Comments:         Anesthesia Quick Evaluation  

## 2013-11-04 NOTE — Progress Notes (Signed)
Suzeanne CRNA in to see pt and admistering Fentanly 2cc via epidural

## 2013-11-04 NOTE — Progress Notes (Signed)
Lisa Rubio is a 23 y.o. G1P0 at 8052w5d by LMP admitted for induction of labor due to Post dates. Due date 10-23-13.  Subjective:   Objective: BP 118/67  Pulse 132  Temp(Src) 98.8 F (37.1 C) (Oral)  Resp 20  Ht 5\' 7"  (1.702 m)  Wt 290 lb (131.543 kg)  BMI 45.41 kg/m2  SpO2 97%  LMP 01/16/2013   Total I/O In: -  Out: 850 [Urine:850]  FHT:  FHR: 120-130 bpm, variability: moderate,  accelerations:  Present,  decelerations:  Present variable UC:   regular, every 2-3 minutes SVE:   Dilation: 8.5 Effacement (%): 80 Station: -1 Exam by:: Lisa RainwaterPamela Lawson RN   Labs: Lab Results  Component Value Date   WBC 9.4 11/02/2013   HGB 10.6* 11/02/2013   HCT 32.9* 11/02/2013   MCV 82.5 11/02/2013   PLT 259 11/02/2013    Assessment / Plan: Induction of labor due to postdates,  progressing well on pitocin  Labor: Progressing normally Preeclampsia:  n/a Fetal Wellbeing:  Category I Pain Control:  Epidural I/D:  n/a Anticipated MOD:  NSVD  Lisa Rubio A 11/04/2013, 1:15 PM

## 2013-11-04 NOTE — Progress Notes (Signed)
Lisa ClossShykeila Rubio is a 23 y.o. G1P0 at 1230w5d by LMP admitted for induction of labor due to Post dates. Due date 1-31-5. 1 Subjective:   Objective: BP 137/58  Pulse 110  Temp(Src) 98.2 F (36.8 C) (Oral)  Resp 18  Ht 5\' 7"  (1.702 m)  Wt 290 lb (131.543 kg)  BMI 45.41 kg/m2  SpO2 94%  LMP 01/16/2013      FHT:  FHR: 120-130 bpm, variability: moderate,  accelerations:  Present,  decelerations:  Present variable UC:   regular, every 2-4 minutes SVE:   Dilation: 2 Effacement (%): 100 Station: -2;-3 Exam by:: T Hall RNC  Labs: Lab Results  Component Value Date   WBC 9.4 11/02/2013   HGB 10.6* 11/02/2013   HCT 32.9* 11/02/2013   MCV 82.5 11/02/2013   PLT 259 11/02/2013    Assessment / Plan: Postdates.  IOL.  Pitocin prn.  Labor: Latent phase Preeclampsia:  n/a Fetal Wellbeing:  Category I Pain Control:  Stadol I/D:  n/a Anticipated MOD:  NSVD  Raford Brissett A 11/04/2013, 2:01 AM

## 2013-11-04 NOTE — Progress Notes (Signed)
PC received from MD.  MD reviewing strip and reviewing contractions pattern.  RN may increase Pitocin if needed to maintain adequate labor pattern

## 2013-11-04 NOTE — Progress Notes (Signed)
Fey Palomarez is a [age] y.o. G1P0 at 256w5d by LMP admitted for induction of labor due to Post dates. Due date 10-23-13.  Subjective:   Objective: BP 137/51  Pulse 134  Temp(Src) 97.5 F (36.4 C) (Oral)  Resp 20  Ht 5\' 7"  (1.702 m)  Wt 290 lb (131.543 kg)  BMI 45.41 kg/m2  SpO2 100%  LMP 01/16/2013      FHT:  FHR: 120-130 bpm, variability: moderate,  accelerations:  Present,  decelerations:  Present early UC:   regular, every 2-3 minutes SVE:   Dilation: 6 Effacement (%): 80 Station: -2 Exam by:: Murphy OilJackson-Moore  Labs: Lab Results  Component Value Date   WBC 9.4 11/02/2013   HGB 10.6* 11/02/2013   HCT 32.9* 11/02/2013   MCV 82.5 11/02/2013   PLT 259 11/02/2013    Assessment / Plan: Induction of labor due to postdates,  progressing well on pitocin  Labor: Progressing normally Preeclampsia:  n/a Fetal Wellbeing:  Category I Pain Control:  Epidural I/D:  n/a Anticipated MOD:  NSVD  Caidyn Henricksen A 11/04/2013, 9:19 AM

## 2013-11-04 NOTE — Progress Notes (Signed)
Rodd Scientist, research (physical sciences)Moore CRNA in to re-dose epidural Lidocaine 2% carbonated 5cc

## 2013-11-04 NOTE — Progress Notes (Signed)
MD updated on VE and pt with lots of pressure. Anesthesia has been called to help with pt pain mgt.

## 2013-11-05 LAB — CBC
HCT: 26.9 % — ABNORMAL LOW (ref 36.0–46.0)
HEMOGLOBIN: 8.8 g/dL — AB (ref 12.0–15.0)
MCH: 26.7 pg (ref 26.0–34.0)
MCHC: 32.7 g/dL (ref 30.0–36.0)
MCV: 81.5 fL (ref 78.0–100.0)
PLATELETS: 234 10*3/uL (ref 150–400)
RBC: 3.3 MIL/uL — AB (ref 3.87–5.11)
RDW: 16.4 % — ABNORMAL HIGH (ref 11.5–15.5)
WBC: 13.9 10*3/uL — ABNORMAL HIGH (ref 4.0–10.5)

## 2013-11-05 NOTE — Progress Notes (Signed)
Post Partum Day 1 Subjective: no complaints  Objective: Blood pressure 122/85, pulse 111, temperature 98.9 F (37.2 C), temperature source Oral, resp. rate 19, height 5\' 7"  (1.702 m), weight 290 lb (131.543 kg), last menstrual period 01/16/2013, SpO2 98.00%, unknown if currently breastfeeding.  Physical Exam:  General: alert and no distress Lochia: appropriate Uterine Fundus: firm Incision: healing well DVT Evaluation: No evidence of DVT seen on physical exam.   Recent Labs  11/05/13 0535  HGB 8.8*  HCT 26.9*    Assessment/Plan: Plan for discharge tomorrow.  Anemia.  Clinically stable.  Iron Rx.   LOS: 3 days   [name],[name] 11/05/2013, 8:22 AM

## 2013-11-05 NOTE — Anesthesia Postprocedure Evaluation (Signed)
  Anesthesia Post-op Note  Anesthesia Post Note  Patient: Lisa ClossShykeila Strayer  Procedure(s) Performed: * No procedures listed *  Anesthesia type: Epidural  Patient location: Mother/Baby  Post pain: Pain level controlled  Post assessment: Post-op Vital signs reviewed  Last Vitals:  Filed Vitals:   11/05/13 0441  BP: 122/85  Pulse: 111  Temp: 37.2 C  Resp: 19    Post vital signs: Reviewed  Level of consciousness:alert  Complications: No apparent anesthesia complications

## 2013-11-06 NOTE — Discharge Instructions (Signed)
Discharge instructions   You can wash your hair  Shower  Eat what you want  Drink what you want  See me in 6 weeks  Your ankles are going to swell more in the next 2 weeks than when pregnant  No sex for 6 weeks   Cleola Perryman A, MD 11/06/2013

## 2013-11-06 NOTE — Discharge Summary (Signed)
Obstetric Discharge Summary Reason for Admission: induction of labor Prenatal Procedures: none Intrapartum Procedures: spontaneous vaginal delivery Postpartum Procedures: none Complications-Operative and Postpartum: none Hemoglobin  Date Value Ref Range Status  11/05/2013 8.8* 12.0 - 15.0 g/dL Final     HCT  Date Value Ref Range Status  11/05/2013 26.9* 36.0 - 46.0 % Final    Physical Exam:  General: alert Lochia: appropriate Uterine Fundus: firm Incision: healing well DVT Evaluation: No evidence of DVT seen on physical exam.  Discharge Diagnoses: Term Pregnancy-delivered  Discharge Information: Date: 11/06/2013 Activity: pelvic rest Diet: routine Medications: Percocet Condition: stable Instructions: refer to practice specific booklet Discharge to: home Follow-up Information   Schedule an appointment as soon as possible for a visit in 6 weeks to follow up.      Newborn Data: Live born female  Birth Weight: 7 lb 11 oz (3487 g) APGAR: 8, 9  Home with mother.  MARSHALL,BERNARD A 11/06/2013, 6:18 AM

## 2013-11-08 NOTE — Progress Notes (Signed)
Ur chart review completed.  

## 2013-12-15 ENCOUNTER — Encounter: Payer: Self-pay | Admitting: *Deleted

## 2013-12-23 ENCOUNTER — Ambulatory Visit: Payer: Managed Care, Other (non HMO) | Admitting: Obstetrics

## 2014-01-06 ENCOUNTER — Ambulatory Visit: Payer: Managed Care, Other (non HMO) | Admitting: Obstetrics

## 2014-05-15 IMAGING — US US OB COMP LESS 14 WK
1 series · 13 of 27 positions shown · non-contrast
Comparison: none

[Series 1: us ob comp less 14 wks · 27 acquisitions, 13 frames shown]
[im 2/27]
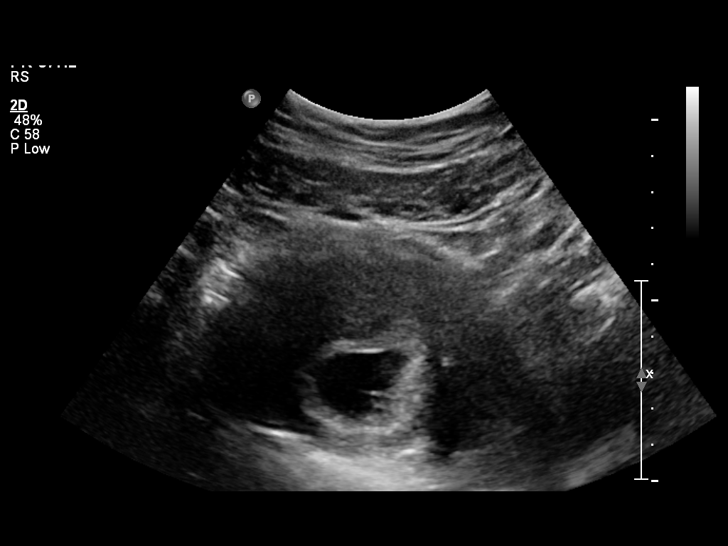
[im 4/27]
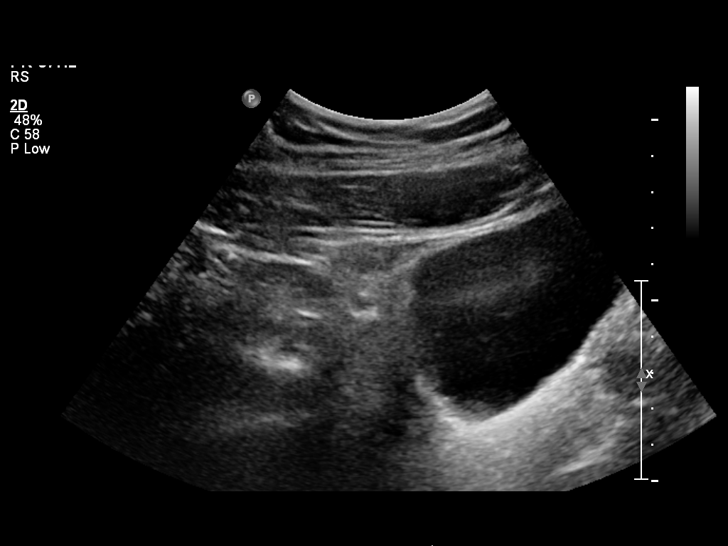
[im 6/27]
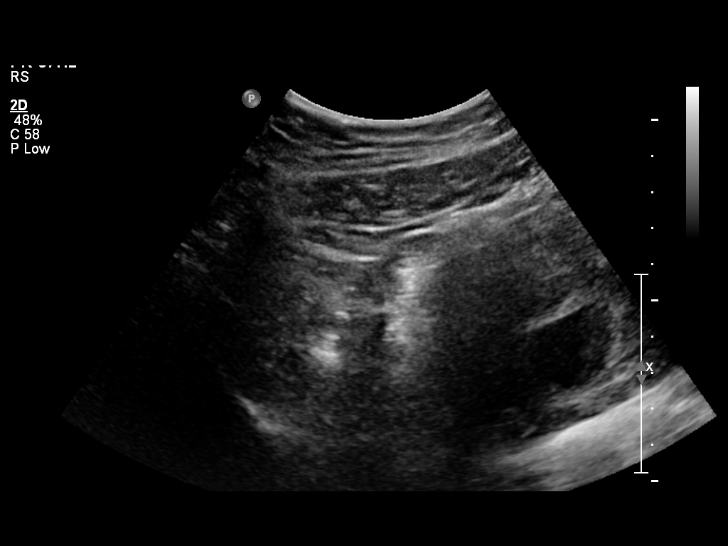
[im 8/27]
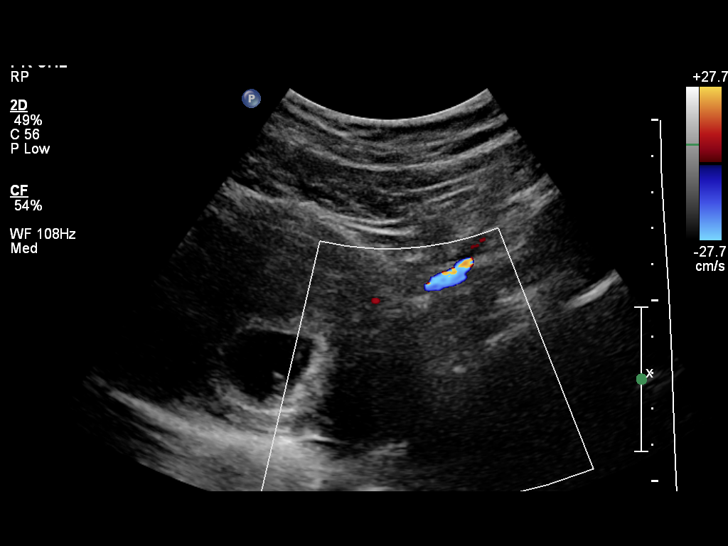
[im 10/27]
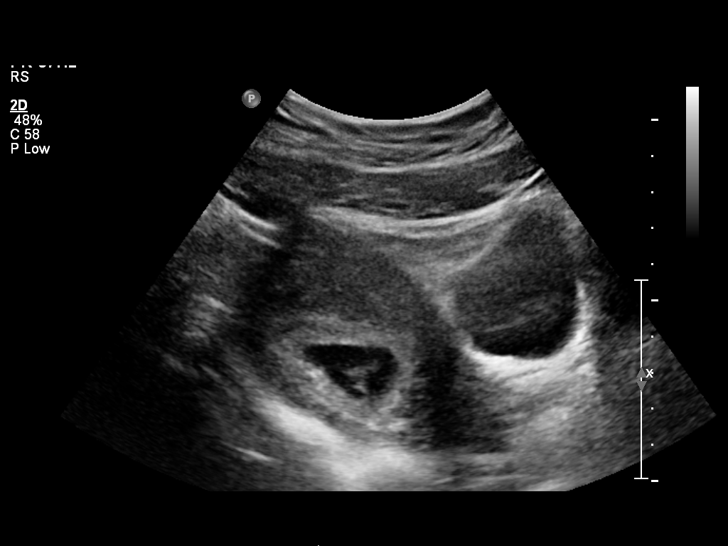
[im 12/27]
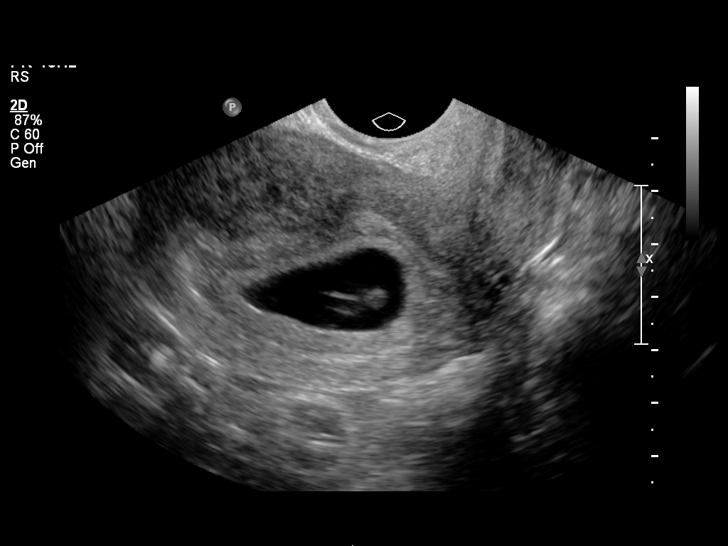
[im 14/27]
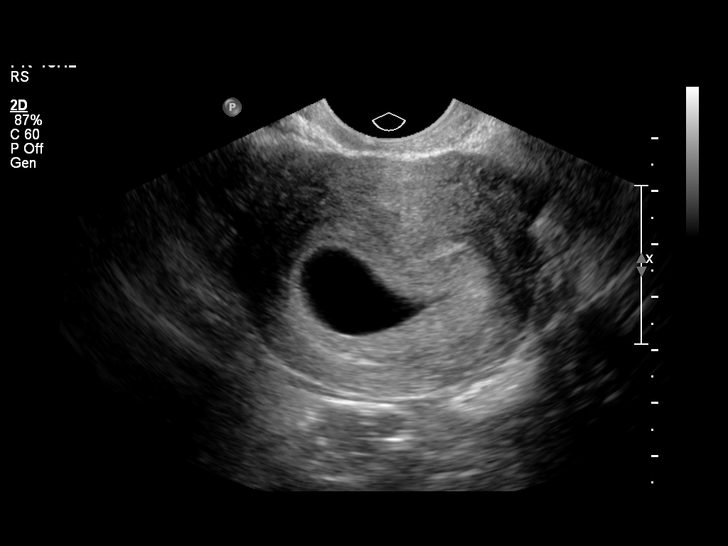
[im 16/27]
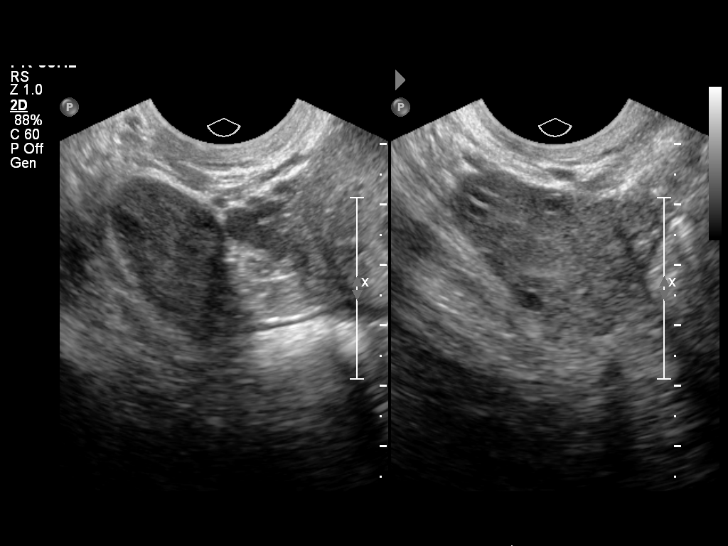
[im 18/27]
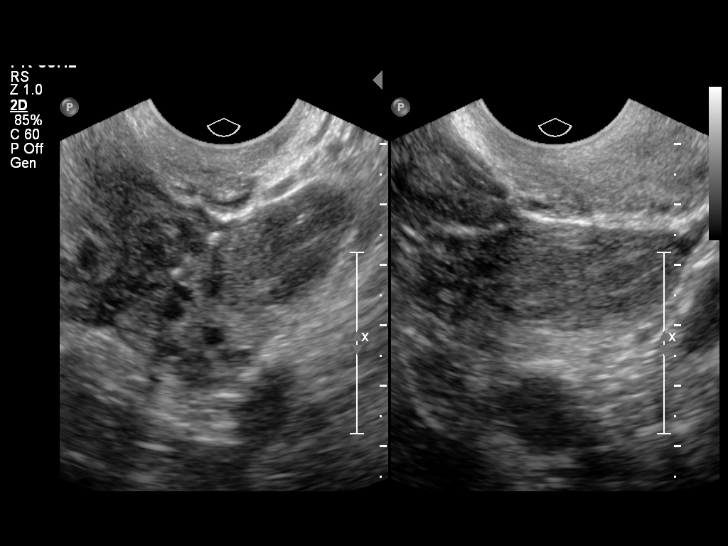
[im 20/27]
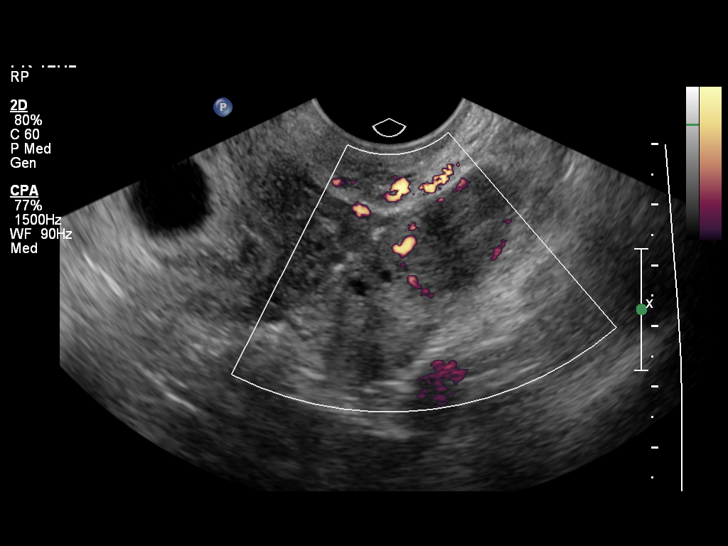
[im 22/27]
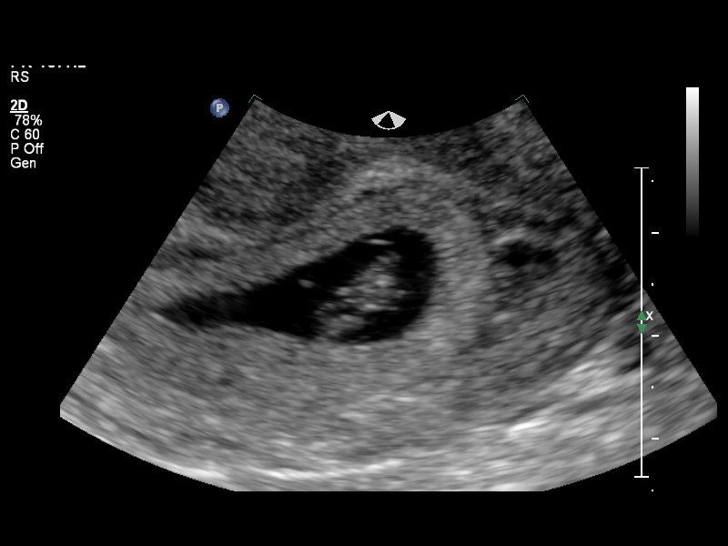
[im 24/27]
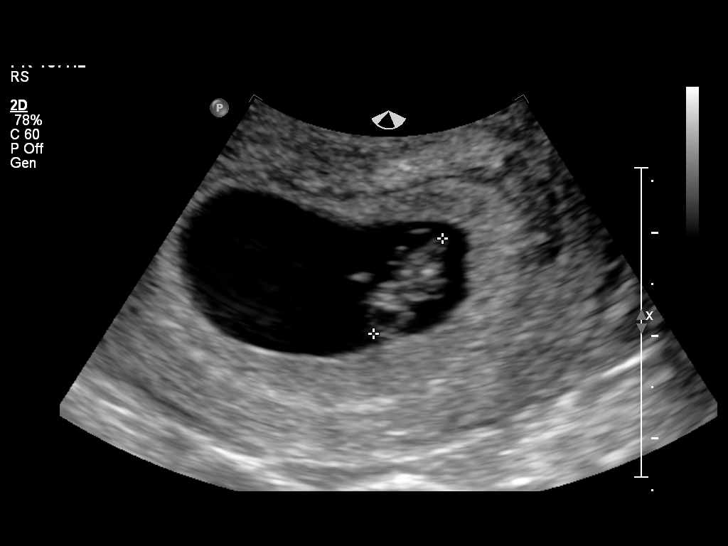
[im 26/27]
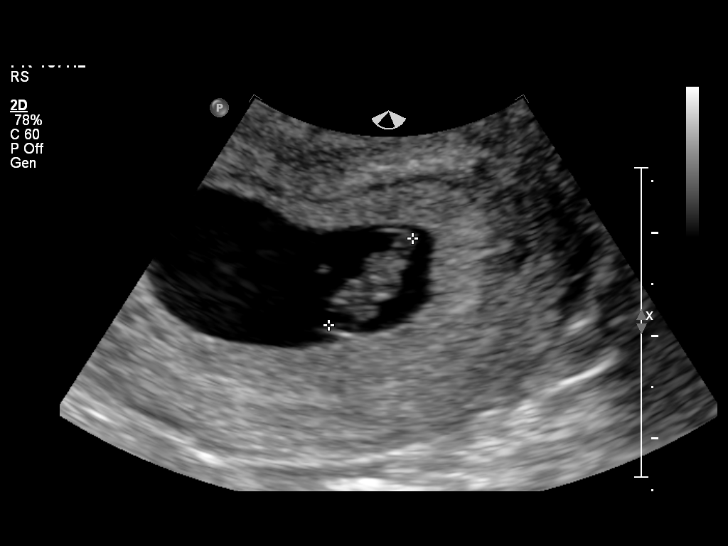

[13 of 27 positions shown; findings below may reference images not displayed]

OBSTETRICS REPORT
                      (Signed Final 03/10/2013 [DATE])

Service(s) Provided

 US OB COMP LESS 14 WKS                                76801.0
 US OB TRANSVAGINAL                                    76817.0
Indications

 Pain - Abdominal/Pelvic
Fetal Evaluation

 Num Of Fetuses:    1
 Preg. Location:    Intrauterine
 Gest. Sac:         Intrauterine
 Yolk Sac:          Visualized
 Fetal Pole:        Visualized
 Fetal Heart Rate:  156                         bpm
 Cardiac Activity:  Observed
Biometry

 CRL:     11.5  mm    G. Age:   7w 2d                  EDD:   10/25/13
Gestational Age

 LMP:           7w 4d        Date:   01/16/13                 EDD:   10/23/13
 Best:          7w 2d     Det. By:   U/S C R L (03/10/13)     EDD:   10/25/13
Cervix Uterus Adnexa

 Cervix:       Normal appearance by transvaginal scan
 Uterus:       No abnormality visualized.
 Left Ovary:   No adnexal mass visualized.
 Right Ovary:  No adnexal mass visualized.

 Adnexa:     No abnormality visualized.
Impression

 Single living IUP with assigned GA of 7w 2d. EGA by
 ultrasound is concordant with dating by ultrasound.
Recommendations

 US for fetal anatomic evaluation at 18-19 wks GA.
 questions or concerns.

## 2014-07-25 ENCOUNTER — Encounter (HOSPITAL_COMMUNITY): Payer: Self-pay

## 2014-09-19 ENCOUNTER — Encounter: Payer: Self-pay | Admitting: *Deleted
